# Patient Record
Sex: Male | Born: 1975
Health system: Southern US, Community
[De-identification: ages and names within clinical notes are randomized; demographics above are authoritative.]

## PROBLEM LIST (undated history)

## (undated) DIAGNOSIS — K219 Gastro-esophageal reflux disease without esophagitis: Secondary | ICD-10-CM

## (undated) DIAGNOSIS — R011 Cardiac murmur, unspecified: Secondary | ICD-10-CM

## (undated) DIAGNOSIS — J209 Acute bronchitis, unspecified: Secondary | ICD-10-CM

## (undated) DIAGNOSIS — R1032 Left lower quadrant pain: Secondary | ICD-10-CM

## (undated) HISTORY — PX: WISDOM TOOTH EXTRACTION: SHX21

## (undated) HISTORY — DX: Left lower quadrant pain: R10.32

## (undated) HISTORY — DX: Cardiac murmur, unspecified: R01.1

## (undated) HISTORY — DX: Acute bronchitis, unspecified: J20.9

## (undated) HISTORY — DX: Gastro-esophageal reflux disease without esophagitis: K21.9

---

## 2011-04-15 ENCOUNTER — Encounter (INDEPENDENT_AMBULATORY_CARE_PROVIDER_SITE_OTHER): Payer: Self-pay | Admitting: Surgery

## 2011-04-25 ENCOUNTER — Ambulatory Visit (INDEPENDENT_AMBULATORY_CARE_PROVIDER_SITE_OTHER): Payer: 59 | Admitting: Surgery

## 2011-05-09 ENCOUNTER — Encounter (INDEPENDENT_AMBULATORY_CARE_PROVIDER_SITE_OTHER): Payer: Self-pay | Admitting: Surgery

## 2011-05-10 ENCOUNTER — Ambulatory Visit (INDEPENDENT_AMBULATORY_CARE_PROVIDER_SITE_OTHER): Payer: 59 | Admitting: Surgery

## 2011-05-20 ENCOUNTER — Encounter (INDEPENDENT_AMBULATORY_CARE_PROVIDER_SITE_OTHER): Payer: Self-pay | Admitting: Surgery

## 2011-06-08 ENCOUNTER — Encounter (INDEPENDENT_AMBULATORY_CARE_PROVIDER_SITE_OTHER): Payer: Self-pay | Admitting: Surgery

## 2011-06-13 ENCOUNTER — Encounter (INDEPENDENT_AMBULATORY_CARE_PROVIDER_SITE_OTHER): Payer: Self-pay | Admitting: Surgery

## 2011-06-13 ENCOUNTER — Ambulatory Visit (INDEPENDENT_AMBULATORY_CARE_PROVIDER_SITE_OTHER): Payer: 59 | Admitting: Surgery

## 2011-06-13 VITALS — BP 120/88 | HR 64 | Temp 97.6°F | Resp 18 | Ht 71.0 in | Wt 200.4 lb

## 2011-06-13 DIAGNOSIS — R1032 Left lower quadrant pain: Secondary | ICD-10-CM

## 2011-06-13 NOTE — Progress Notes (Signed)
Chief Complaint  Patient presents with  . New Evaluation    eval of left inguinal hernia     HPI Isaac Adams is a 35 y.o. male.   HPIThis gentleman is referred by Dr. Cain Saupe for evaluation of a possible left inguinal hernia. 4 years, the patient reports he has had some intermittent sharp discomfort in his left inguinal area. It will sometimes ache on his thigh and into his left testicle. He has never noticed a bulge in the groin.  Past Medical History  Diagnosis Date  . Inguinal hernia   . Acute bronchitis   . Abdominal pain, left lower quadrant   . Heart murmur   . Nasal congestion   . Lt groin pain     occasional     Past Surgical History  Procedure Date  . Wisdom tooth extraction     History reviewed. No pertinent family history.  Social History History  Substance Use Topics  . Smoking status: Former Smoker    Types: Cigarettes  . Smokeless tobacco: Not on file  . Alcohol Use: Yes    No Known Allergies  Current Outpatient Prescriptions  Medication Sig Dispense Refill  . omeprazole (PRILOSEC) 40 MG capsule Take 40 mg by mouth daily.          Review of Systems Review of Systems  Constitutional: Negative for fever, chills and unexpected weight change.  HENT: Negative for hearing loss, congestion, sore throat, trouble swallowing and voice change.   Eyes: Negative for visual disturbance.  Respiratory: Negative for cough and wheezing.   Cardiovascular: Negative for chest pain, palpitations and leg swelling.  Gastrointestinal: Negative for nausea, vomiting, abdominal pain, diarrhea, constipation, blood in stool, abdominal distention, anal bleeding and rectal pain.  Genitourinary: Negative for hematuria and difficulty urinating.  Musculoskeletal: Negative for arthralgias.  Skin: Negative for rash and wound.  Neurological: Negative for seizures, syncope, weakness and headaches.  Hematological: Negative for adenopathy. Does not bruise/bleed easily.    Psychiatric/Behavioral: Negative for confusion.    Blood pressure 120/88, pulse 64, temperature 97.6 F (36.4 C), temperature source Temporal, resp. rate 18, height 5\' 11"  (1.803 m), weight 200 lb 6 oz (90.89 kg).  Physical Exam Physical Exam  Constitutional: He is oriented to person, place, and time. He appears well-developed and well-nourished. No distress.  HENT:  Head: Normocephalic and atraumatic.  Right Ear: External ear normal.  Left Ear: External ear normal.  Nose: Nose normal.  Mouth/Throat: Oropharynx is clear and moist.  Eyes: Conjunctivae are normal. Pupils are equal, round, and reactive to light. No scleral icterus.  Neck: Normal range of motion. Neck supple. No tracheal deviation present.  Cardiovascular: Normal rate, regular rhythm, normal heart sounds and intact distal pulses.   Pulmonary/Chest: Effort normal and breath sounds normal. No respiratory distress. He has no wheezes.  Abdominal: Soft. Bowel sounds are normal. He exhibits no distension. There is no tenderness. There is no rebound.       With the patient both lying and standing I cannot demonstrate either a left or right inguinal hernia.  Musculoskeletal: Normal range of motion. He exhibits edema. He exhibits no tenderness.  Lymphadenopathy:    He has no cervical adenopathy.  Neurological: He is alert and oriented to person, place, and time.  Skin: Skin is warm and dry. No rash noted. No erythema.  Psychiatric: His behavior is normal. Judgment normal.    Data Reviewed I had the primary care physician's notes which I have reviewed  Assessment  Patient with left groin pain of uncertain etiology    Plan    At this point, as I cannot feel inguinal hernia, we will follow this expectantly. This may represent just an acute muscle strength or a very early hernia. I discussed whether hernia repair should be performed one exploration without an obvious defect or bulge. At this point he would like to hold  surgery which I feel is very reasonable. He will call back and come see me should he develop a bulge in the groin       Elexis Pollak A 06/13/2011, 4:06 PM

## 2015-04-17 ENCOUNTER — Ambulatory Visit: Payer: Self-pay | Admitting: Family Medicine

## 2016-09-16 DIAGNOSIS — M791 Myalgia: Secondary | ICD-10-CM | POA: Diagnosis not present

## 2016-09-16 DIAGNOSIS — M940 Chondrocostal junction syndrome [Tietze]: Secondary | ICD-10-CM | POA: Diagnosis not present

## 2016-09-16 DIAGNOSIS — R079 Chest pain, unspecified: Secondary | ICD-10-CM | POA: Diagnosis not present

## 2016-12-23 ENCOUNTER — Ambulatory Visit: Payer: Self-pay | Admitting: Family Medicine

## 2016-12-26 ENCOUNTER — Ambulatory Visit (INDEPENDENT_AMBULATORY_CARE_PROVIDER_SITE_OTHER): Payer: BLUE CROSS/BLUE SHIELD | Admitting: Family Medicine

## 2016-12-26 ENCOUNTER — Encounter: Payer: Self-pay | Admitting: Family Medicine

## 2016-12-26 VITALS — BP 132/76 | Temp 98.1°F | Ht 71.0 in | Wt 208.5 lb

## 2016-12-26 DIAGNOSIS — M940 Chondrocostal junction syndrome [Tietze]: Secondary | ICD-10-CM

## 2016-12-26 DIAGNOSIS — K219 Gastro-esophageal reflux disease without esophagitis: Secondary | ICD-10-CM

## 2016-12-26 MED ORDER — OMEPRAZOLE 20 MG PO CPDR: 40.0000 mg | DELAYED_RELEASE_CAPSULE | Freq: Every day | ORAL | 0 refills | Status: DC

## 2016-12-26 NOTE — Progress Notes (Signed)
   Subjective:    Patient ID: Isaac FischerJoseph R. Douty, male    DOB: 10-13-1975, 41 y.o.   MRN: [id number]  HPI 41 yr old male to establish with us after transfering [facility] the [facility]. He has not had a check up in several years. His main concern today is chest pains that started about 3 months ago. These are sharp in quality though not severe. They seem to locate along the left anterior chest area. He notices that deep breathing and twisting his trunk can make these pains worse. The pains are never associated with exertion. He never has SOB or nausea or sweats with them. They last from 5 to 15 minutes a a time and then go away. He has been averaging 2-3 times a week for these pains. He has GERD and he knows what heartburn feels like. He says the chest pains are very different in nature.    Review of Systems  Constitutional: Negative.   Respiratory: Negative.   Cardiovascular: Positive for chest pain. Negative for palpitations and leg swelling.  Gastrointestinal: Negative.   Neurological: Negative.        Objective:   Physical Exam  Constitutional: He is oriented to person, place, and time. He appears well-developed and well-nourished. No distress.  Cardiovascular: Normal rate, regular rhythm, normal heart sounds and intact distal pulses.   Pulmonary/Chest: Effort normal and breath sounds normal. No respiratory distress. He has no wheezes. He has no rales. He exhibits no tenderness.  Abdominal: Soft. Bowel sounds are normal. He exhibits no distension and no mass. There is no tenderness. There is no rebound and no guarding.  Neurological: He is alert and oriented to person, place, and time.          Assessment & Plan:  The chest pains he describes today sound like costochondritis. We discussed the benign nature of these pains and I suggested he try Ibuprofen as needed. His GERD seems to be well controlled. He will set up a well exam with labs soon.  Gershon CraneStephen Blanch Stang, MD

## 2017-01-05 ENCOUNTER — Ambulatory Visit (INDEPENDENT_AMBULATORY_CARE_PROVIDER_SITE_OTHER): Payer: BLUE CROSS/BLUE SHIELD | Admitting: Family Medicine

## 2017-01-05 ENCOUNTER — Encounter: Payer: Self-pay | Admitting: Family Medicine

## 2017-01-05 VITALS — BP 106/81 | HR 85 | Temp 98.5°F | Ht 71.0 in | Wt 207.0 lb

## 2017-01-05 DIAGNOSIS — Z0000 Encounter for general adult medical examination without abnormal findings: Secondary | ICD-10-CM

## 2017-01-05 LAB — CBC WITH DIFFERENTIAL/PLATELET
Basophils Absolute: 0.1 10*3/uL (ref 0.0–0.1)
Basophils Relative: 0.7 % (ref 0.0–3.0)
Eosinophils Absolute: 0.2 10*3/uL (ref 0.0–0.7)
Eosinophils Relative: 3.2 % (ref 0.0–5.0)
HCT: 46.8 % (ref 39.0–52.0)
Hemoglobin: 16.2 g/dL (ref 13.0–17.0)
Lymphocytes Relative: 32.1 % (ref 12.0–46.0)
Lymphs Abs: 2.3 10*3/uL (ref 0.7–4.0)
MCHC: 34.7 g/dL (ref 30.0–36.0)
MCV: 89.6 fl (ref 78.0–100.0)
Monocytes Absolute: 0.4 10*3/uL (ref 0.1–1.0)
Monocytes Relative: 5.9 % (ref 3.0–12.0)
Neutro Abs: 4.1 10*3/uL (ref 1.4–7.7)
Neutrophils Relative %: 58.1 % (ref 43.0–77.0)
Platelets: 321 10*3/uL (ref 150.0–400.0)
RBC: 5.23 Mil/uL (ref 4.22–5.81)
RDW: 13.2 % (ref 11.5–15.5)
WBC: 7.1 10*3/uL (ref 4.0–10.5)

## 2017-01-05 LAB — BASIC METABOLIC PANEL
BUN: 13 mg/dL (ref 6–23)
CO2: 26 mEq/L (ref 19–32)
Calcium: 9.3 mg/dL (ref 8.4–10.5)
Chloride: 106 mEq/L (ref 96–112)
Creatinine, Ser: 1.06 mg/dL (ref 0.40–1.50)
GFR: 82.86 mL/min (ref >60.00)
Glucose, Bld: 100 mg/dL — ABNORMAL HIGH (ref 70–99)
Potassium: 4.2 mEq/L (ref 3.5–5.1)
Sodium: 138 mEq/L (ref 135–145)

## 2017-01-05 LAB — HEPATIC FUNCTION PANEL
ALT: 19 U/L (ref 0–53)
AST: 15 U/L (ref 0–37)
Albumin: 4.5 g/dL (ref 3.5–5.2)
Alkaline Phosphatase: 55 U/L (ref 39–117)
Bilirubin, Direct: 0.1 mg/dL (ref 0.0–0.3)
Total Bilirubin: 0.7 mg/dL (ref 0.2–1.2)
Total Protein: 7.1 g/dL (ref 6.0–8.3)

## 2017-01-05 LAB — POC URINALSYSI DIPSTICK (AUTOMATED)
Bilirubin, UA: NEGATIVE
Blood, UA: NEGATIVE
Glucose, UA: NEGATIVE
Ketones, UA: NEGATIVE
Leukocytes, UA: NEGATIVE
Nitrite, UA: NEGATIVE
Protein, UA: NEGATIVE
Spec Grav, UA: 1.025 (ref 1.010–1.025)
Urobilinogen, UA: 0.2 E.U./dL
pH, UA: 6 (ref 5.0–8.0)

## 2017-01-05 LAB — LIPID PANEL
Cholesterol: 252 mg/dL — ABNORMAL HIGH (ref 0–200)
HDL: 34.9 mg/dL — ABNORMAL LOW (ref >39.00)
NonHDL: 217.27
Total CHOL/HDL Ratio: 7
Triglycerides: 271 mg/dL — ABNORMAL HIGH (ref 0.0–149.0)
VLDL: 54.2 mg/dL — ABNORMAL HIGH (ref 0.0–40.0)

## 2017-01-05 LAB — TSH: TSH: 2.27 u[IU]/mL (ref 0.35–4.50)

## 2017-01-05 LAB — LDL CHOLESTEROL, DIRECT: Direct LDL: 161 mg/dL

## 2017-01-05 NOTE — Patient Instructions (Signed)
WE NOW OFFER   Isaac Adams's FAST TRACK!!!  SAME DAY Appointments for ACUTE CARE  Such as: Sprains, Injuries, cuts, abrasions, rashes, muscle pain, joint pain, back pain Colds, flu, sore throats, headache, allergies, cough, fever  Ear pain, sinus and eye infections Abdominal pain, nausea, vomiting, diarrhea, upset stomach Animal/insect bites  3 Easy Ways to Schedule: Walk-In Scheduling Call in scheduling Mychart Sign-up: [email or web address]         

## 2017-01-05 NOTE — Progress Notes (Signed)
   Subjective:    Patient ID: Isaac Adams, male    DOB: 05/21/1976, 41 y.o.   MRN: [id number]  HPI 41 yr old male for a well exam. He feels fine today. We recently saw him for some costochondritis pain, but this resolved.    Review of Systems  Constitutional: Negative.   HENT: Negative.   Eyes: Negative.   Respiratory: Negative.   Cardiovascular: Negative.   Gastrointestinal: Negative.   Genitourinary: Negative.   Musculoskeletal: Negative.   Skin: Negative.   Neurological: Negative.   Psychiatric/Behavioral: Negative.        Objective:   Physical Exam  Constitutional: He is oriented to person, place, and time. He appears well-developed and well-nourished. No distress.  HENT:  Head: Normocephalic and atraumatic.  Right Ear: External ear normal.  Left Ear: External ear normal.  Nose: Nose normal.  Mouth/Throat: Oropharynx is clear and moist. No oropharyngeal exudate.  Eyes: Conjunctivae and EOM are normal. Pupils are equal, round, and reactive to light. Right eye exhibits no discharge. Left eye exhibits no discharge. No scleral icterus.  Neck: Neck supple. No JVD present. No tracheal deviation present. No thyromegaly present.  Cardiovascular: Normal rate, regular rhythm, normal heart sounds and intact distal pulses.  Exam reveals no gallop and no friction rub.   No murmur heard. Pulmonary/Chest: Effort normal and breath sounds normal. No respiratory distress. He has no wheezes. He has no rales. He exhibits no tenderness.  Abdominal: Soft. Bowel sounds are normal. He exhibits no distension and no mass. There is no tenderness. There is no rebound and no guarding.  Genitourinary: Penis normal. No penile tenderness.  Musculoskeletal: Normal range of motion. He exhibits no edema or tenderness.  Lymphadenopathy:    He has no cervical adenopathy.  Neurological: He is alert and oriented to person, place, and time. He has normal reflexes. No cranial nerve deficit. He exhibits  normal muscle tone. Coordination normal.  Skin: Skin is warm and dry. No rash noted. He is not diaphoretic. No erythema. No pallor.  Psychiatric: He has a normal mood and affect. His behavior is normal. Judgment and thought content normal.          Assessment & Plan:  Well exam. We discussed diet and exercise. Get fasting labs.  Gershon CraneStephen Fry, MD

## 2017-01-17 ENCOUNTER — Encounter: Payer: Self-pay | Admitting: Family Medicine

## 2017-07-04 DIAGNOSIS — H5213 Myopia, bilateral: Secondary | ICD-10-CM | POA: Diagnosis not present

## 2017-07-07 ENCOUNTER — Ambulatory Visit (INDEPENDENT_AMBULATORY_CARE_PROVIDER_SITE_OTHER): Payer: BLUE CROSS/BLUE SHIELD

## 2017-07-07 ENCOUNTER — Ambulatory Visit (INDEPENDENT_AMBULATORY_CARE_PROVIDER_SITE_OTHER): Payer: BLUE CROSS/BLUE SHIELD | Admitting: Family Medicine

## 2017-07-07 ENCOUNTER — Encounter: Payer: Self-pay | Admitting: Family Medicine

## 2017-07-07 VITALS — BP 102/62 | HR 80 | Temp 98.2°F | Wt 209.4 lb

## 2017-07-07 DIAGNOSIS — R053 Chronic cough: Secondary | ICD-10-CM

## 2017-07-07 DIAGNOSIS — R05 Cough: Secondary | ICD-10-CM

## 2017-07-07 MED ORDER — HYDROCODONE-HOMATROPINE 5-1.5 MG/5ML PO SYRP: 5.0000 mL | ORAL_SOLUTION | ORAL | 0 refills | Status: DC | PRN

## 2017-07-07 NOTE — Progress Notes (Signed)
   Subjective:    Patient ID: Isaac Adams, male    DOB: 06-11-1976, 41 y.o.   MRN: [id number]  HPI Here to discuss a dry cough he has had for a month. He denies any allergy symptoms or PND. He has frequent heartburn but seldom takes anything for this. He is a former smoker.    Review of Systems  Constitutional: Negative.   HENT: Negative.   Eyes: Negative.   Respiratory: Positive for cough. Negative for apnea, choking, chest tightness, shortness of breath, wheezing and stridor.   Cardiovascular: Negative.   Neurological: Negative.        Objective:   Physical Exam  Constitutional: He is oriented to person, place, and time. He appears well-developed and well-nourished.  HENT:  Right Ear: External ear normal.  Left Ear: External ear normal.  Nose: Nose normal.  Mouth/Throat: Oropharynx is clear and moist.  Eyes: Conjunctivae are normal.  Neck: No thyromegaly present.  Cardiovascular: Normal rate, regular rhythm, normal heart sounds and intact distal pulses.  Pulmonary/Chest: Effort normal and breath sounds normal. No respiratory distress. He has no wheezes. He has no rales.  Lymphadenopathy:    He has no cervical adenopathy.  Neurological: He is alert and oriented to person, place, and time.          Assessment & Plan:  His cough is likely due to GERD he will take Omeprazole 20 mg daily. Set up a CXR.  Gershon CraneStephen Fry, MD

## 2017-07-12 ENCOUNTER — Telehealth: Payer: Self-pay | Admitting: Family Medicine

## 2017-07-12 NOTE — Telephone Encounter (Signed)
His CXR was normal and the lungs were clear

## 2017-07-12 NOTE — Telephone Encounter (Signed)
Copied from CRM [phone]. Topic: General - Other >> Jul 12, 2017 10:07 AM Percival SpanishKennedy, Cheryl W wrote:    Pt said he had xrays done last week and was suppose to received a call back on Monday. Pt req a call back with his results    [phone]

## 2017-07-12 NOTE — Telephone Encounter (Signed)
Sent to PCP looks like X-ray is in.

## 2017-07-12 NOTE — Telephone Encounter (Signed)
Called pt. Pt advised and voiced understanding.  

## 2017-07-26 ENCOUNTER — Ambulatory Visit (INDEPENDENT_AMBULATORY_CARE_PROVIDER_SITE_OTHER): Payer: BLUE CROSS/BLUE SHIELD

## 2017-07-26 ENCOUNTER — Ambulatory Visit (INDEPENDENT_AMBULATORY_CARE_PROVIDER_SITE_OTHER): Payer: BLUE CROSS/BLUE SHIELD | Admitting: Family Medicine

## 2017-07-26 ENCOUNTER — Encounter: Payer: Self-pay | Admitting: Family Medicine

## 2017-07-26 VITALS — BP 120/90 | HR 110 | Temp 99.1°F | Wt 209.9 lb

## 2017-07-26 DIAGNOSIS — M545 Low back pain, unspecified: Secondary | ICD-10-CM

## 2017-07-26 DIAGNOSIS — R000 Tachycardia, unspecified: Secondary | ICD-10-CM | POA: Diagnosis not present

## 2017-07-26 DIAGNOSIS — M47816 Spondylosis without myelopathy or radiculopathy, lumbar region: Secondary | ICD-10-CM | POA: Diagnosis not present

## 2017-07-26 MED ORDER — CYCLOBENZAPRINE HCL 5 MG PO TABS: 5.0000 mg | ORAL_TABLET | Freq: Three times a day (TID) | ORAL | 0 refills | Status: DC | PRN

## 2017-07-26 MED ORDER — MELOXICAM 7.5 MG PO TABS: 7.5000 mg | ORAL_TABLET | Freq: Every day | ORAL | 0 refills | Status: DC

## 2017-07-26 NOTE — Patient Instructions (Addendum)
Back Pain, Adult Many adults have back pain from time to time. Common causes of back pain include:  A strained muscle or ligament.  Wear and tear (degeneration) of the spinal disks.  Arthritis.  A hit to the back.  Back pain can be short-lived (acute) or last a long time (chronic). A physical exam, lab tests, and imaging studies may be done to find the cause of your pain. Follow these instructions at home: Managing pain and stiffness  Take over-the-counter and prescription medicines only as told by your health care provider.  If directed, apply heat to the affected area as often as told by your health care provider. Use the heat source that your health care provider recommends, such as a moist heat pack or a heating pad. ? Place a towel between your skin and the heat source. ? Leave the heat on for 20-30 minutes. ? Remove the heat if your skin turns bright red. This is especially important if you are unable to feel pain, heat, or cold. You have a greater risk of getting burned.  If directed, apply ice to the injured area: ? Put ice in a plastic bag. ? Place a towel between your skin and the bag. ? Leave the ice on for 20 minutes, 2-3 times a day for the first 2-3 days. Activity  Do not stay in bed. Resting more than 1-2 days can delay your recovery.  Take short walks on even surfaces as soon as you are able. Try to increase the length of time you walk each day.  Do not sit, drive, or stand in one place for more than 30 minutes at a time. Sitting or standing for long periods of time can put stress on your back.  Use proper lifting techniques. When you bend and lift, use positions that put less stress on your back: ? Bend your knees. ? Keep the load close to your body. ? Avoid twisting.  Exercise regularly as told by your health care provider. Exercising will help your back heal faster. This also helps prevent back injuries by keeping muscles strong and flexible.  Your health  care provider may recommend that you see a physical therapist. This person can help you come up with a safe exercise program. Do any exercises as told by your physical therapist. Lifestyle  Maintain a healthy weight. Extra weight puts stress on your back and makes it difficult to have good posture.  Avoid activities or situations that make you feel anxious or stressed. Learn ways to manage anxiety and stress. One way to manage stress is through exercise. Stress and anxiety increase muscle tension and can make back pain worse. General instructions  Sleep on a firm mattress in a comfortable position. Try lying on your side with your knees slightly bent. If you lie on your back, put a pillow under your knees.  Follow your treatment plan as told by your health care provider. This may include: ? Cognitive or behavioral therapy. ? Acupuncture or massage therapy. ? Meditation or yoga. Contact a health care provider if:  You have pain that is not relieved with rest or medicine.  You have increasing pain going down into your legs or buttocks.  Your pain does not improve in 2 weeks.  You have pain at night.  You lose weight.  You have a fever or chills. Get help right away if:  You develop new bowel or bladder control problems.  You have unusual weakness or numbness in your arms   or legs.  You develop nausea or vomiting.  You develop abdominal pain.  You feel faint. Summary  Many adults have back pain from time to time. A physical exam, lab tests, and imaging studies may be done to find the cause of your pain.  Use proper lifting techniques. When you bend and lift, use positions that put less stress on your back.  Take over-the-counter and prescription medicines and apply heat or ice as directed by your health care provider. This information is not intended to replace advice given to you by your health care provider. Make sure you discuss any questions you have with your health care  provider. Document Released: 07/25/2005 Document Revised: 08/29/2016 Document Reviewed: 08/29/2016 Elsevier Interactive Patient Education  2018 [employer]  Back Exercises The following exercises strengthen the muscles that help to support the back. They also help to keep the lower back flexible. Doing these exercises can help to prevent back pain or lessen existing pain. If you have back pain or discomfort, try doing these exercises 2-3 times each day or as told by your health care provider. When the pain goes away, do them once each day, but increase the number of times that you repeat the steps for each exercise (do more repetitions). If you do not have back pain or discomfort, do these exercises once each day or as told by your health care provider. Exercises Single Knee to Chest  Repeat these steps 3-5 times for each leg: 1. Lie on your back on a firm bed or the floor with your legs extended. 2. Bring one knee to your chest. Your other leg should stay extended and in contact with the floor. 3. Hold your knee in place by grabbing your knee or thigh. 4. Pull on your knee until you feel a gentle stretch in your lower back. 5. Hold the stretch for 10-30 seconds. 6. Slowly release and straighten your leg.  Pelvic Tilt  Repeat these steps 5-10 times: 1. Lie on your back on a firm bed or the floor with your legs extended. 2. Bend your knees so they are pointing toward the ceiling and your feet are flat on the floor. 3. Tighten your lower abdominal muscles to press your lower back against the floor. This motion will tilt your pelvis so your tailbone points up toward the ceiling instead of pointing to your feet or the floor. 4. With gentle tension and even breathing, hold this position for 5-10 seconds.  Cat-Cow  Repeat these steps until your lower back becomes more flexible: 1. Get into a hands-and-knees position on a firm surface. Keep your hands under your shoulders, and keep your  knees under your hips. You may place padding under your knees for comfort. 2. Let your head hang down, and point your tailbone toward the floor so your lower back becomes rounded like the back of a cat. 3. Hold this position for 5 seconds. 4. Slowly lift your head and point your tailbone up toward the ceiling so your back forms a sagging arch like the back of a cow. 5. Hold this position for 5 seconds.  Press-Ups  Repeat these steps 5-10 times: 1. Lie on your abdomen (face-down) on the floor. 2. Place your palms near your head, about shoulder-width apart. 3. While you keep your back as relaxed as possible and keep your hips on the floor, slowly straighten your arms to raise the top half of your body and lift your shoulders. Do not use your back   muscles to raise your upper torso. You may adjust the placement of your hands to make yourself more comfortable. 4. Hold this position for 5 seconds while you keep your back relaxed. 5. Slowly return to lying flat on the floor.  Bridges  Repeat these steps 10 times: 1. Lie on your back on a firm surface. 2. Bend your knees so they are pointing toward the ceiling and your feet are flat on the floor. 3. Tighten your buttocks muscles and lift your buttocks off of the floor until your waist is at almost the same height as your knees. You should feel the muscles working in your buttocks and the back of your thighs. If you do not feel these muscles, slide your feet 1-2 inches farther away from your buttocks. 4. Hold this position for 3-5 seconds. 5. Slowly lower your hips to the starting position, and allow your buttocks muscles to relax completely.  If this exercise is too easy, try doing it with your arms crossed over your chest. Abdominal Crunches  Repeat these steps 5-10 times: 1. Lie on your back on a firm bed or the floor with your legs extended. 2. Bend your knees so they are pointing toward the ceiling and your feet are flat on the  floor. 3. Cross your arms over your chest. 4. Tip your chin slightly toward your chest without bending your neck. 5. Tighten your abdominal muscles and slowly raise your trunk (torso) high enough to lift your shoulder blades a tiny bit off of the floor. Avoid raising your torso higher than that, because it can put too much stress on your low back and it does not help to strengthen your abdominal muscles. 6. Slowly return to your starting position.  Back Lifts Repeat these steps 5-10 times: 1. Lie on your abdomen (face-down) with your arms at your sides, and rest your forehead on the floor. 2. Tighten the muscles in your legs and your buttocks. 3. Slowly lift your chest off of the floor while you keep your hips pressed to the floor. Keep the back of your head in line with the curve in your back. Your eyes should be looking at the floor. 4. Hold this position for 3-5 seconds. 5. Slowly return to your starting position.  Contact a health care provider if:  Your back pain or discomfort gets much worse when you do an exercise.  Your back pain or discomfort does not lessen within 2 hours after you exercise. If you have any of these problems, stop doing these exercises right away. Do not do them again unless your health care provider says that you can. Get help right away if:  You develop sudden, severe back pain. If this happens, stop doing the exercises right away. Do not do them again unless your health care provider says that you can. This information is not intended to replace advice given to you by your health care provider. Make sure you discuss any questions you have with your health care provider. Document Released: 09/01/2004 Document Revised: 12/02/2015 Document Reviewed: 09/18/2014 Elsevier Interactive Patient Education  2017 Elsevier Inc.  

## 2017-07-26 NOTE — Progress Notes (Signed)
Subjective:    Patient ID: Herbert DeanerJoseph Rush Sebastiano, male    DOB: [birth year], 41 y.o.   MRN: [id number]  Chief Complaint  Patient presents with  . Back Pain    HPI Patient was seen today for acute concern.  Sunday while lifting a toilet seat to clean it patient felt a pop in his lower back.  Patient endorses low back pain.  A few days later he felt as if his legs would not support him, however patient denies pain, numbness, tingling in legs.  Patient has taken ibuprofen.  Past Medical History:  Diagnosis Date  . Abdominal pain, left lower quadrant   . Acute bronchitis   . GERD (gastroesophageal reflux disease)   . Heart murmur    as a child, resolved   . Inguinal hernia   . Lt groin pain    occasional     No Known Allergies  ROS General: Denies fever, chills, night sweats, changes in weight, changes in appetite HEENT: Denies headaches, ear pain, changes in vision, rhinorrhea, sore throat CV: Denies CP, palpitations, SOB, orthopnea Pulm: Denies SOB, cough, wheezing GI: Denies abdominal pain, nausea, vomiting, diarrhea, constipation GU: Denies dysuria, hematuria, frequency, vaginal discharge Msk: Denies muscle cramps, joint pains  + low back pain Neuro: Denies weakness, numbness, tingling Skin: Denies rashes, bruising Psych: Denies depression, anxiety, hallucinations     Objective:    Blood pressure 120/90, pulse (!) 110, temperature 99.1 F (37.3 C), temperature source Oral, weight 209 lb 14.4 oz (95.2 kg).   Gen. Pleasant, well-nourished, in no distress, normal affect   HEENT: [place]/AT, face symmetric, no scleral icterus, PERRLA, nares patent without drainage. Lungs: no accessory muscle use, CTAB, no wheezes or rales Cardiovascular: RRR, no m/r/g, no peripheral edema Musculoskeletal: Limited ROM of spine 2/2 pain with flexion and extension.  TTP of lumbar paraspinal muscles.  Normal strength in LEs b/l.  No deformities, no cyanosis or clubbing, normal tone Neuro:  A&Ox3, CN  II-XII intact, normal gait    Wt Readings from Last 3 Encounters:  07/26/17 209 lb 14.4 oz (95.2 kg)  07/07/17 209 lb 6.4 oz (95 kg)  01/05/17 207 lb (93.9 kg)    Lab Results  Component Value Date   WBC 7.1 01/05/2017   HGB 16.2 01/05/2017   HCT 46.8 01/05/2017   PLT 321.0 01/05/2017   GLUCOSE 100 (H) 01/05/2017   CHOL 252 (H) 01/05/2017   TRIG 271.0 (H) 01/05/2017   HDL 34.90 (L) 01/05/2017   LDLDIRECT 161.0 01/05/2017   ALT 19 01/05/2017   AST 15 01/05/2017   NA 138 01/05/2017   K 4.2 01/05/2017   CL 106 01/05/2017   CREATININE 1.06 01/05/2017   BUN 13 01/05/2017   CO2 26 01/05/2017   TSH 2.27 01/05/2017    Assessment/Plan:  Acute bilateral low back pain without sciatica  -Discussed likely disease course. -Given handout - Plan: DG Lumbar Spine Complete, cyclobenzaprine (FLEXERIL) 5 MG tablet, meloxicam (MOBIC) 7.5 MG tablet -Call patient with x-ray results -Follow-up PRN   Abbe AmsterdamShannon Thursa Emme, MD

## 2017-07-28 ENCOUNTER — Encounter: Payer: Self-pay | Admitting: Family Medicine

## 2017-07-28 NOTE — Telephone Encounter (Signed)
I saw the Xray report. Tell him to use the muscle relaxer tid and to take 2 of the Meloxicam tablets together once a day. Add Tylenol 500 mg to take 2 tabs bid. Use heat. If not better by Monday let me known, and we can arrange for some PT

## 2017-08-09 ENCOUNTER — Encounter: Payer: Self-pay | Admitting: Family Medicine

## 2017-08-09 DIAGNOSIS — M545 Low back pain, unspecified: Secondary | ICD-10-CM

## 2017-08-11 NOTE — Telephone Encounter (Signed)
I went ahead and referred him to PT

## 2017-08-14 ENCOUNTER — Ambulatory Visit (INDEPENDENT_AMBULATORY_CARE_PROVIDER_SITE_OTHER): Payer: BLUE CROSS/BLUE SHIELD | Admitting: Family Medicine

## 2017-08-14 ENCOUNTER — Encounter: Payer: Self-pay | Admitting: Family Medicine

## 2017-08-14 VITALS — BP 104/60 | HR 73 | Temp 99.0°F | Wt 205.8 lb

## 2017-08-14 DIAGNOSIS — M5442 Lumbago with sciatica, left side: Secondary | ICD-10-CM

## 2017-08-14 DIAGNOSIS — G8929 Other chronic pain: Secondary | ICD-10-CM

## 2017-08-14 DIAGNOSIS — M5441 Lumbago with sciatica, right side: Secondary | ICD-10-CM

## 2017-08-14 MED ORDER — DICLOFENAC SODIUM 75 MG PO TBEC: 75.0000 mg | DELAYED_RELEASE_TABLET | Freq: Two times a day (BID) | ORAL | 2 refills | Status: DC

## 2017-08-14 NOTE — Progress Notes (Signed)
   Subjective:    Patient ID: Isaac DeanerJoseph Rush Heffern, male    DOB: 27-Nov-1975, 42 y.o.   MRN: [id number]  HPI Here to follow up on low back pain. He has dealt with some pain off and on since he was a teenager and he was told he had some scoliosis years ago. He had the sudden onset of low back pain on 07-23-18 as he was bent over cleaning a toilet. He had Xrays taken which showed some lumbar scoliosis and some early spurring. He has been taking Meloxicam and Flexeril, and he has been doing stretches at home. He feels a little better but still has pain that radiates into both thighs. No numbness or weakness.    Review of Systems  Constitutional: Negative.   Respiratory: Negative.   Cardiovascular: Negative.   Musculoskeletal: Positive for back pain.       Objective:   Physical Exam  Constitutional: He appears well-developed and well-nourished. No distress.  Cardiovascular: Normal rate, regular rhythm, normal heart sounds and intact distal pulses.  Pulmonary/Chest: Effort normal and breath sounds normal. No respiratory distress. He has no wheezes. He has no rales.  Musculoskeletal:  He has slight scoliosis of the thoracic and lumbar spines. Full ROM. He is mildly tender over the lower spine. Negative SLR.           Assessment & Plan:  Low back pain due to a combination of early arthritis and mild scoliosis. Try Diclofenac 75 mg bid. Refer to PT.  Gershon CraneStephen Bonner Larue, MD

## 2017-08-15 NOTE — Telephone Encounter (Signed)
He was seen OV yesterday.

## 2017-08-16 ENCOUNTER — Ambulatory Visit: Payer: BLUE CROSS/BLUE SHIELD | Attending: Family Medicine | Admitting: Physical Therapy

## 2017-08-16 ENCOUNTER — Encounter: Payer: Self-pay | Admitting: Physical Therapy

## 2017-08-16 DIAGNOSIS — R293 Abnormal posture: Secondary | ICD-10-CM | POA: Insufficient documentation

## 2017-08-16 DIAGNOSIS — M5440 Lumbago with sciatica, unspecified side: Secondary | ICD-10-CM | POA: Diagnosis not present

## 2017-08-16 DIAGNOSIS — G8929 Other chronic pain: Secondary | ICD-10-CM | POA: Insufficient documentation

## 2017-08-16 NOTE — Patient Instructions (Signed)
Trigger Point Dry Needling  . What is Trigger Point Dry Needling (DN)? o DN is a physical therapy technique used to treat muscle pain and dysfunction. Specifically, DN helps deactivate muscle trigger points (muscle knots).  o A thin filiform needle is used to penetrate the skin and stimulate the underlying trigger point. The goal is for a local twitch response (LTR) to occur and for the trigger point to relax. No medication of any kind is injected during the procedure.   . What Does Trigger Point Dry Needling Feel Like?  o The procedure feels different for each individual patient. Some patients report that they do not actually feel the needle enter the skin and overall the process is not painful. Very mild bleeding may occur. However, many patients feel a deep cramping in the muscle in which the needle was inserted. This is the local twitch response.   Marland Kitchen. How Will I feel after the treatment? o Soreness is normal, and the onset of soreness may not occur for a few hours. Typically this soreness does not last longer than two days.  o Bruising is uncommon, however; ice can be used to decrease any possible bruising.  o In rare cases feeling tired or nauseous after the treatment is normal. In addition, your symptoms may get worse before they get better, this period will typically not last longer than 24 hours.   . What Can I do After My Treatment? o Increase your hydration by drinking more water for the next 24 hours. o You may place ice or heat on the areas treated that have become sore, however, do not use heat on inflamed or bruised areas. Heat often brings more relief post needling. o You can continue your regular activities, but vigorous activity is not recommended initially after the treatment for 24 hours. o DN is best combined with other physical therapy such as strengthening, stretching, and other therapies.       LUMBAR ROLL  Use a small lumbar roll in chairs you sit at frequently. Place  the roll at the lower curve of your back. This will help you maintain better posture.        Prone Press ups ([employer])  Lie flat on your stomach with your arms in push up position. Press up to the point of restriction and down again. Your therapist may want you to move your legs to the left or right before you press up. Repeat.  Every 2-3 hours, 10-15 reps      HIP FLEXOR STRETCH 4  While lying on a table or high bed, let the affected leg lower towards the floor until a stretch is felt along the front of your thigh.    At the same time, slowly bend your affected knee to add more stretch and grasp your opposite knee and pull it towards your chest.  Hold for 30 sec, repeat 2x   [facility] [address], [address] [place], [place] [address] Phone # [phone] Fax [phone]

## 2017-08-16 NOTE — Therapy (Signed)
[facility] [address] [place], [place], [address] Phone: [phone]   Fax:  [phone]  Physical Therapy Evaluation  Patient Details  Name: Isaac Adams MRN: [id number] Date of Birth: [birth year] Referring Provider: Gershon Crane, MD    Encounter Date: 08/16/2017  PT End of Session - 08/16/17 0935    Visit Number  1    Number of Visits  16    Date for PT Re-Evaluation  10/11/17    Authorization Type  BCBS    Authorization Time Period  08/16/17 to 10/11/17    PT Start Time  0846    PT Stop Time  0929    PT Time Calculation (min)  43 min    Activity Tolerance  No increased pain;Patient tolerated treatment well    Behavior During Therapy  [facility] for tasks assessed/performed       Past Medical History:  Diagnosis Date  . Abdominal pain, left lower quadrant   . Acute bronchitis   . GERD (gastroesophageal reflux disease)   . Heart murmur    as a child, resolved   . Inguinal hernia   . Lt groin pain    occasional     Past Surgical History:  Procedure Laterality Date  . WISDOM TOOTH EXTRACTION      There were no vitals filed for this visit.   Subjective Assessment - 08/16/17 0858    Subjective  Pt reports having long of back pain, would only last a day or so at a time. He did [employer] for about 10 years and would occasionally have flare ups of his pain. Back in December 2016, he was bending forward to clean the toilet when he felt a pop in his back and was unable to walk for the pain for 2-3 days. Since then, his pain improved overall, but he feels it has plateaued recently.     Pertinent History  GERD    Limitations  Sitting    How long can you sit comfortably?  30 min, less if unsupported     Patient Stated Goals  decrease pain or learn how to manage pain     Currently in Pain?  Yes    Pain Score  4     Pain Location  Back    Pain Descriptors / Indicators  Aching;Dull    Pain Type  Chronic pain     Pain Radiating Towards  none, but notes pain in the groin area     Pain Onset  More than a month ago    Pain Frequency  Intermittent    Aggravating Factors   sitting for too long    Pain Relieving Factors  standing    Effect of Pain on Daily Activities  unabel to sit long at work          [facility] PT Assessment - 08/16/17 0001      Assessment   Medical Diagnosis  Chronic LBP with sciatica    Referring Provider  Gershon Crane, MD     Onset Date/Surgical Date  07/23/17    Next MD Visit  none for now     Prior Therapy  none       Precautions   Precautions  None      Balance Screen   Has the patient fallen in the past 6 months  No    Has the patient had a decrease in activity level because of a fear of falling?   No  Is the patient reluctant to leave their home because of a fear of falling?   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; currently using a standing desk which has helped      Cognition   Overall Cognitive Status  Within Functional Limits for tasks assessed      Sensation   Light Touch  Appears Intact      Posture/Postural Control   Posture/Postural Control  --    Posture Comments  Forward head, rounded shoulders, decreased lumbar lordosis in sitting      ROM / Strength   AROM / PROM / Strength  AROM;Strength      AROM   AROM Assessment Site  Lumbar    Lumbar Flexion  increased pain/stretch low back; flattening of lumbar spine    Lumbar Extension  pain free x10 reps      Strength   Overall Strength Comments  BLE grossly 5/5 MMT and pain free      Flexibility   Soft Tissue Assessment /Muscle Length  yes    Hamstrings  WNL    Quadriceps  (+) thomas test for hip flexor and quadriceps tightess, Lt and Rt       Palpation   Spinal mobility  hypomobile lumbar and thoracic spine    Palpation comment  muscle tension and tenderness along lumbar paraspinals      Special Tests     Special Tests  Lumbar    Lumbar Tests  FABER test;Prone Knee Bend Test;Straight Leg Raise      FABER test   findings  Negative      Prone Knee Bend Test   Findings  Negative      Straight Leg Raise   Findings  Negative             Objective measurements completed on examination: See above findings.      [facility] Adult PT Treatment/Exercise - 08/16/17 0001      Self-Care   Self-Care  Posture    Posture  towel roll for lumbar support       Exercises   Exercises  Lumbar      Lumbar Exercises: Stretches   Hip Flexor Stretch  1 rep;30 seconds    Hip Flexor Stretch Limitations  supine thomas position       Lumbar Exercises: Prone   Other Prone Lumbar Exercises  prone pressup x10 reps              PT Education - 08/16/17 0930    Education provided  Yes    Education Details  eval findings/POC; use of towel roll for lumbar support; directional preference for extension; dry neeedling    Person(s) Educated  Patient    Methods  Explanation;Handout    Comprehension  Verbalized understanding;Returned demonstration       PT Short Term Goals - 08/16/17 0936      PT SHORT TERM GOAL #1   Title  Pt will be independent with his initial HEP to decrease pain with sitting.     Time  4    Period  Weeks    Status  New    Target Date  09/13/17      PT SHORT TERM GOAL #2   Title  Pt will report atleast 40% improvement in pain with sitting, in combination with the proper use of a lumbar  towel roll for additional back support.     Time  4    Period  Weeks    Status  New        PT Long Term Goals - 08/16/17 1020      PT LONG TERM GOAL #1   Title  Pt will demo improved hip extension ROM to atleast neutral during thomas testing position, to improve his postural alignment and glute recruitment with activity.     Time  8    Period  Weeks    Status  New    Target Date  10/11/17      PT LONG TERM GOAL #2   Title  Pt will be able to lift atleast 25# box with proper  mechanics and without pain and therapist cuing x5 reps.     Time  8    Period  Weeks    Status  New      PT LONG TERM GOAL #3   Title  Pt will report atleast 75% reduction in his low back pain throughout the day, to improve his quality of life.    Time  8    Period  Weeks    Status  New      PT LONG TERM GOAL #4   Title  Pt will demonstrate improved posture awareness, evident by his ability to maintain upright unsupported seated posture during his session without cuing from the therapist.     Time  8    Period  Weeks    Status  New      PT LONG TERM GOAL #5   Title  Pt will score no more than 26% limitation on FOTO to reflect significant improvements in activity completion and pain.     Time  8    Period  Weeks    Status  New             Plan - 08/16/17 1226    Clinical Impression Statement  Pt is a 42 y.o M referred to OPPT with complaints of a recent exacerbation of low back pain while bending forward cleaning the house. He has improved some since, however recently notes a plateau in progress. He demonstrates limited lumbar AROM, pain with flexion primarily, in addition to increased pain with sitting more than 10-15 minutes. He does have significant limitations in hip flexor flexibility, poor posture awareness and increased muscle spasm/joint restrcition along the lumbar spine which is contributing to his poor sitting tolerance at this time. He would benefit from skilled PT to address his limitations in ROM, flexibility and decrease muscle spasm and pain to allow him to return to regular leisure and work activity without difficulty.     History and Personal Factors relevant to plan of care:  history of scoliosis and LBP over the course of 15+ years    Clinical Presentation  Stable    Clinical Presentation due to:  was improving, but now stable    Clinical Decision Making  Low    Rehab Potential  Good    PT Frequency  Other (comment) 1-2x/week    PT Duration  8 weeks    PT  Treatment/Interventions  ADLs/Self Care Home Management;Cryotherapy;Electrical Stimulation;Traction;Moist Heat;Therapeutic exercise;Therapeutic activities;Functional mobility training;Neuromuscular re-education;Patient/family education;Manual techniques;Passive range of motion;Dry needling;Taping    PT Next Visit Plan  follow up on pt interest in dry needling; lumbar/thoracic spine mobilizations, manual to address muscle spasm of lumbar spine; gentle lumbar ROM    PT Home Exercise Plan  prone press ups, hip flexor stretch, towel roll for lumbar support    Consulted and Agree with Plan of Care  Patient       Patient will benefit from skilled therapeutic intervention in order to improve the following deficits and impairments:  Decreased activity tolerance, Impaired flexibility, Hypomobility, Decreased range of motion, Decreased mobility, Increased muscle spasms, Postural dysfunction, Pain, Improper body mechanics  Visit Diagnosis: Chronic bilateral low back pain with sciatica, sciatica laterality unspecified  Abnormal posture     Problem List Patient Active Problem List   Diagnosis Date Noted  . Low back pain with bilateral sciatica 08/14/2017  . GERD (gastroesophageal reflux disease) 12/26/2016   1:03 PM,08/16/17 Marylyn Ishihara PT, DPT [facility]  [phone]  [facility] [address], [address] [place], [place], [address] Phone: [phone]   Fax:  [phone]  Name: Isaac Adams MRN: [id number] Date of Birth: 1976-07-21

## 2017-08-23 ENCOUNTER — Ambulatory Visit: Payer: BLUE CROSS/BLUE SHIELD | Admitting: Physical Therapy

## 2017-08-23 DIAGNOSIS — R293 Abnormal posture: Secondary | ICD-10-CM | POA: Diagnosis not present

## 2017-08-23 DIAGNOSIS — M5440 Lumbago with sciatica, unspecified side: Secondary | ICD-10-CM | POA: Diagnosis not present

## 2017-08-23 DIAGNOSIS — G8929 Other chronic pain: Secondary | ICD-10-CM

## 2017-08-23 NOTE — Therapy (Signed)
[facility] [address], [address] [place], [place], [address] Phone: [phone]   Fax:  [phone]  Physical Therapy Treatment  Patient Details  Name: Isaac Adams MRN: [id number] Date of Birth: 03/22/1976 Referring Provider: Gershon Crane, MD    Encounter Date: 08/23/2017  PT End of Session - 08/23/17 0904    Visit Number  2    Number of Visits  16    Date for PT Re-Evaluation  10/11/17    Authorization Type  BCBS    Authorization Time Period  08/16/17 to 10/11/17    PT Start Time  0804    PT Stop Time  0847    PT Time Calculation (min)  43 min       Past Medical History:  Diagnosis Date  . Abdominal pain, left lower quadrant   . Acute bronchitis   . GERD (gastroesophageal reflux disease)   . Heart murmur    as a child, resolved   . Inguinal hernia   . Lt groin pain    occasional     Past Surgical History:  Procedure Laterality Date  . WISDOM TOOTH EXTRACTION      There were no vitals filed for this visit.  Subjective Assessment - 08/23/17 0808    Subjective  Pt states he has not felt much change since initial visit but is able to do the exercises    Pertinent History  GERD    Limitations  Sitting    How long can you sit comfortably?  30 min, less if unsupported     Patient Stated Goals  decrease pain or learn how to manage pain     Currently in Pain?  Yes    Pain Score  3     Pain Location  Back    Pain Orientation  Mid;Lower    Pain Descriptors / Indicators  Pressure    Pain Type  Chronic pain    Pain Radiating Towards  into the groin    Pain Onset  More than a month ago    Pain Frequency  Intermittent    Aggravating Factors   sitting increases when standing    Pain Relieving Factors  standing    Effect of Pain on Daily Activities  unable to sit long periods                      [facility] Adult PT Treatment/Exercise - 08/23/17 0001      Therapeutic Activites    Therapeutic  Activities  Other Therapeutic Activities    Other Therapeutic Activities  sit to stand with TrA and tucking tailbone      Neuro Re-ed    Neuro Re-ed Details   TrA bracing techniques in supine and sitting      Lumbar Exercises: Stretches   Hip Flexor Stretch  Right;Left;2 reps;20 seconds standing foot on chair      Lumbar Exercises: Supine   Ab Set  10 reps    Clam  20 reps red band    Bent Knee Raise  2 seconds;20 reps    Straight Leg Raise  10 reps    Large Ball Abdominal Isometric  20 reps      Lumbar Exercises: Prone   Other Prone Lumbar Exercises  prone pressup x5reps x 10 sec      Manual Therapy   Manual Therapy  Myofascial release;Soft tissue mobilization    Manual therapy comments  prone  Soft tissue mobilization  lumar erectors, QL, deep paraspinals bilaterally    Myofascial Release  thoracolumbar fascial and sacral distraction             PT Education - 08/23/17 0847    Education provided  Yes    Education Details  sit to stand with core contraction and hip flexor stretch in standing    Person(s) Educated  Patient    Methods  Explanation;Demonstration    Comprehension  Verbalized understanding;Returned demonstration       PT Short Term Goals - 08/23/17 0813      PT SHORT TERM GOAL #1   Title  Pt will be independent with his initial HEP to decrease pain with sitting.     Time  4    Period  Weeks    Status  On-going      PT SHORT TERM GOAL #2   Title  Pt will report atleast 40% improvement in pain with sitting, in combination with the proper use of a lumbar towel roll for additional back support.     Time  4    Period  Weeks    Status  On-going        PT Long Term Goals - 08/16/17 1020      PT LONG TERM GOAL #1   Title  Pt will demo improved hip extension ROM to atleast neutral during thomas testing position, to improve his postural alignment and glute recruitment with activity.     Time  8    Period  Weeks    Status  New    Target Date   10/11/17      PT LONG TERM GOAL #2   Title  Pt will be able to lift atleast 25# box with proper mechanics and without pain and therapist cuing x5 reps.     Time  8    Period  Weeks    Status  New      PT LONG TERM GOAL #3   Title  Pt will report atleast 75% reduction in his low back pain throughout the day, to improve his quality of life.    Time  8    Period  Weeks    Status  New      PT LONG TERM GOAL #4   Title  Pt will demonstrate improved posture awareness, evident by his ability to maintain upright unsupported seated posture during his session without cuing from the therapist.     Time  8    Period  Weeks    Status  New      PT LONG TERM GOAL #5   Title  Pt will score no more than 26% limitation on FOTO to reflect significant improvements in activity completion and pain.     Time  8    Period  Weeks    Status  New            Plan - 08/23/17 0848    Clinical Impression Statement  Pt has not achieved goals yet, it is his first treatment since eval.  Pt is doing stretches at home but having trouble with hip flexor stretch due to soft bed.  Pt has tight lumbar paraspinals, QL and thoracolumbar fascial bilaterally.  He experienced relief of syptoms with sacral distraction.  After receiving education for engaging TrA he did well with TrA activation in supine. Pt appeared to have good form and ability to engage core doing sit to stand after getting verbal  and visual cues with demonstration.  He will benefit from skilled PT to continue to progress core strength, hip and lumbar ROM, decrease muscle spasms and fascial adhesion in order to return to full function.    History and Personal Factors relevant to plan of care:  `    PT Treatment/Interventions  ADLs/Self Care Home Management;Cryotherapy;Electrical Stimulation;Traction;Moist Heat;Therapeutic exercise;Therapeutic activities;Functional mobility training;Neuromuscular re-education;Patient/family education;Manual  techniques;Passive range of motion;Dry needling;Taping    PT Next Visit Plan  lumbar/thoracic soft tissue myofascial release, sacral distraction, gentle ROM to tolerance and core stabilization exercises    Consulted and Agree with Plan of Care  Patient       Patient will benefit from skilled therapeutic intervention in order to improve the following deficits and impairments:  Decreased activity tolerance, Impaired flexibility, Hypomobility, Decreased range of motion, Decreased mobility, Increased muscle spasms, Postural dysfunction, Pain, Improper body mechanics  Visit Diagnosis: Chronic bilateral low back pain with sciatica, sciatica laterality unspecified  Abnormal posture     Problem List Patient Active Problem List   Diagnosis Date Noted  . Low back pain with bilateral sciatica 08/14/2017  . GERD (gastroesophageal reflux disease) 12/26/2016    Vincente PoliJakki Crosser, PT 08/23/2017, 9:07 AM  [facility] [address], [address] [place], [place], [address] Phone: [phone]   Fax:  [phone]  Name: Isaac Adams MRN: [id number] Date of Birth: 01-30-1976

## 2017-08-30 ENCOUNTER — Encounter: Payer: Self-pay | Admitting: Physical Therapy

## 2017-08-30 ENCOUNTER — Ambulatory Visit: Payer: BLUE CROSS/BLUE SHIELD | Admitting: Physical Therapy

## 2017-08-30 DIAGNOSIS — R293 Abnormal posture: Secondary | ICD-10-CM | POA: Diagnosis not present

## 2017-08-30 DIAGNOSIS — M5440 Lumbago with sciatica, unspecified side: Secondary | ICD-10-CM | POA: Diagnosis not present

## 2017-08-30 DIAGNOSIS — G8929 Other chronic pain: Secondary | ICD-10-CM

## 2017-08-30 NOTE — Patient Instructions (Signed)

## 2017-08-30 NOTE — Therapy (Addendum)
[facility] [address] [place], [place], [address] Phone: [phone]   Fax:  [phone]  Physical Therapy Treatment  Patient Details  Name: Isaac Adams MRN: [id number] Date of Birth: 1975/10/29 Referring Provider: Alysia Penna, MD    Encounter Date: 08/30/2017  PT End of Session - 08/30/17 0805    Visit Number  3    Date for PT Re-Evaluation  10/11/17    Authorization Type  BCBS    Authorization Time Period  08/16/17 to 10/11/17    PT Start Time  0805    PT Stop Time  0845    PT Time Calculation (min)  40 min    Activity Tolerance  No increased pain;Patient tolerated treatment well    Behavior During Therapy  [facility] for tasks assessed/performed       Past Medical History:  Diagnosis Date  . Abdominal pain, left lower quadrant   . Acute bronchitis   . GERD (gastroesophageal reflux disease)   . Heart murmur    as a child, resolved   . Inguinal hernia   . Lt groin pain    occasional     Past Surgical History:  Procedure Laterality Date  . WISDOM TOOTH EXTRACTION      There were no vitals filed for this visit.  Subjective Assessment - 08/30/17 0808    Subjective  Still tightens up a lot when sitting.  I am able to stand longer at the standing desk which helps. I don't notice anything sitting on the recumbant bike right now.    Limitations  Sitting    Patient Stated Goals  decrease pain or learn how to manage pain     Currently in Pain?  No/denies                      [facility] Adult PT Treatment/Exercise - 08/30/17 0001      Therapeutic Activites    Therapeutic Activities  --    Other Therapeutic Activities  --      Neuro Re-ed    Neuro Re-ed Details   TrA bracing techniques in supine and sitting      Lumbar Exercises: Stretches   Active Hamstring Stretch  Right;Left;2 reps;30 seconds    Hip Flexor Stretch  Right;Left;2 reps;20 seconds supine with sacral support - single knee to  chest opposite    Piriformis Stretch  Right;Left;2 reps;30 seconds      Lumbar Exercises: Aerobic   Stationary Bike  L 1 x 5 min      Lumbar Exercises: Supine   Ab Set  --    Clam  --    Bent Knee Raise  --    Straight Leg Raise  --    Large Ball Abdominal Isometric  --      Lumbar Exercises: Prone   Other Prone Lumbar Exercises  --      Manual Therapy   Manual Therapy  Myofascial release;Soft tissue mobilization    Manual therapy comments  prone    Soft tissue mobilization  lumar erectors, QL, deep paraspinals bilaterally    Myofascial Release  thoracolumbar fascial and sacral distraction       Trigger Point Dry Needling - 08/30/17 7782    Consent Given?  Yes    Education Handout Provided  Yes    Muscles Treated Upper Body  Quadratus Lumborum    Muscles Treated Lower Body  -- lumbar multifidi  PT Short Term Goals - 08/30/17 0859      PT SHORT TERM GOAL #1   Title  Pt will be independent with his initial HEP to decrease pain with sitting.     Time  4    Period  Weeks    Status  Achieved      PT SHORT TERM GOAL #2   Title  Pt will report atleast 40% improvement in pain with sitting, in combination with the proper use of a lumbar towel roll for additional back support.     Time  4    Period  Weeks    Status  On-going        PT Long Term Goals - 08/16/17 1020      PT LONG TERM GOAL #1   Title  Pt will demo improved hip extension ROM to atleast neutral during thomas testing position, to improve his postural alignment and glute recruitment with activity.     Time  8    Period  Weeks    Status  New    Target Date  10/11/17      PT LONG TERM GOAL #2   Title  Pt will be able to lift atleast 25# box with proper mechanics and without pain and therapist cuing x5 reps.     Time  8    Period  Weeks    Status  New      PT LONG TERM GOAL #3   Title  Pt will report atleast 75% reduction in his low back pain throughout the day, to improve his quality  of life.    Time  8    Period  Weeks    Status  New      PT LONG TERM GOAL #4   Title  Pt will demonstrate improved posture awareness, evident by his ability to maintain upright unsupported seated posture during his session without cuing from the therapist.     Time  8    Period  Weeks    Status  New      PT LONG TERM GOAL #5   Title  Pt will score no more than 26% limitation on FOTO to reflect significant improvements in activity completion and pain.     Time  8    Period  Weeks    Status  New            Plan - 08/30/17 4196    Clinical Impression Statement  Pt responded well to dry needling with the soft tissue massage, palpable increased tissue length and trigger point release.  Pt had trigger points throughout lumbar, QL, and hip flexors.  Pt was educated on dry needling aftercare and understands stretches to maintain newly achieved muscle length.  He will benefit from skilled PT to progress ROM and strength.    Rehab Potential  Good    PT Treatment/Interventions  ADLs/Self Care Home Management;Cryotherapy;Electrical Stimulation;Traction;Moist Heat;Therapeutic exercise;Therapeutic activities;Functional mobility training;Neuromuscular re-education;Patient/family education;Manual techniques;Passive range of motion;Dry needling;Taping    PT Next Visit Plan  f/u on response to dry needle to lumbar muscles, MFR and STM to lumbar, quadruped, core stability    Consulted and Agree with Plan of Care  Patient       Patient will benefit from skilled therapeutic intervention in order to improve the following deficits and impairments:  Decreased activity tolerance, Impaired flexibility, Hypomobility, Decreased range of motion, Decreased mobility, Increased muscle spasms, Postural dysfunction, Pain, Improper body mechanics  Visit Diagnosis: Chronic  bilateral low back pain with sciatica, sciatica laterality unspecified  Abnormal posture     Problem List Patient Active Problem List    Diagnosis Date Noted  . Low back pain with bilateral sciatica 08/14/2017  . GERD (gastroesophageal reflux disease) 12/26/2016    Zannie Cove, PT 08/30/2017, 9:04 AM  [facility] [address], [place], [place], [address] Phone: [phone]   Fax:  [phone]  Name: Isaac Adams MRN: [id number] Date of Birth: 1975-09-08  PHYSICAL THERAPY DISCHARGE SUMMARY  Visits from Start of Care: 3  Current functional level related to goals / functional outcomes: See above details   Remaining deficits: See above   Education / Equipment: HEP  Plan: Patient agrees to discharge.  Patient goals were not met. Patient is being discharged due to not returning since the last visit.  ?????   Google, PT 10/11/17 8:24 AM

## 2017-09-06 ENCOUNTER — Encounter: Payer: BLUE CROSS/BLUE SHIELD | Admitting: Physical Therapy

## 2017-09-12 ENCOUNTER — Encounter: Payer: Self-pay | Admitting: Family Medicine

## 2017-09-12 NOTE — Telephone Encounter (Signed)
We can just call this in. Call in Xanax 0.5 mg to take every 6 hours prn anxiety, #60 with 2 rf

## 2017-09-13 ENCOUNTER — Ambulatory Visit: Payer: BLUE CROSS/BLUE SHIELD | Attending: Family Medicine | Admitting: Physical Therapy

## 2017-09-13 ENCOUNTER — Other Ambulatory Visit: Payer: Self-pay

## 2017-09-13 ENCOUNTER — Telehealth: Payer: Self-pay | Admitting: Physical Therapy

## 2017-09-13 MED ORDER — ALPRAZOLAM ER 0.5 MG PO TB24: ORAL_TABLET | ORAL | 2 refills | Status: DC

## 2017-09-13 NOTE — Telephone Encounter (Signed)
Call in Xanax 0.5 mg to take every 6 hours prn anxiety, #60 with 2 rf   Rx has been called into pt's [facility] pharmacy.   Pt advised and voiced understanding.

## 2017-09-13 NOTE — Telephone Encounter (Signed)
Pt was called due to not showing for appointment today.  Left message on VM  Vincente PoliJakki Crosser, PT 09/13/17 8:37 AM

## 2018-01-29 ENCOUNTER — Encounter: Payer: Self-pay | Admitting: Family Medicine

## 2018-01-29 ENCOUNTER — Ambulatory Visit (INDEPENDENT_AMBULATORY_CARE_PROVIDER_SITE_OTHER): Payer: BLUE CROSS/BLUE SHIELD | Admitting: Family Medicine

## 2018-01-29 VITALS — BP 92/60 | HR 85 | Temp 98.7°F | Ht 71.0 in | Wt 208.0 lb

## 2018-01-29 DIAGNOSIS — R0981 Nasal congestion: Secondary | ICD-10-CM

## 2018-01-29 DIAGNOSIS — Z8249 Family history of ischemic heart disease and other diseases of the circulatory system: Secondary | ICD-10-CM

## 2018-01-29 DIAGNOSIS — E785 Hyperlipidemia, unspecified: Secondary | ICD-10-CM | POA: Diagnosis not present

## 2018-01-29 DIAGNOSIS — M940 Chondrocostal junction syndrome [Tietze]: Secondary | ICD-10-CM

## 2018-01-29 LAB — CBC WITH DIFFERENTIAL/PLATELET
Basophils Absolute: 0.1 10*3/uL (ref 0.0–0.1)
Basophils Relative: 0.8 % (ref 0.0–3.0)
Eosinophils Absolute: 0.3 10*3/uL (ref 0.0–0.7)
Eosinophils Relative: 4.1 % (ref 0.0–5.0)
HCT: 43.7 % (ref 39.0–52.0)
Hemoglobin: 15.4 g/dL (ref 13.0–17.0)
Lymphocytes Relative: 47.4 % — ABNORMAL HIGH (ref 12.0–46.0)
Lymphs Abs: 3.2 10*3/uL (ref 0.7–4.0)
MCHC: 35.3 g/dL (ref 30.0–36.0)
MCV: 89.3 fl (ref 78.0–100.0)
Monocytes Absolute: 0.4 10*3/uL (ref 0.1–1.0)
Monocytes Relative: 5.8 % (ref 3.0–12.0)
Neutro Abs: 2.8 10*3/uL (ref 1.4–7.7)
Neutrophils Relative %: 41.9 % — ABNORMAL LOW (ref 43.0–77.0)
Platelets: 340 10*3/uL (ref 150.0–400.0)
RBC: 4.9 Mil/uL (ref 4.22–5.81)
RDW: 13.4 % (ref 11.5–15.5)
WBC: 6.7 10*3/uL (ref 4.0–10.5)

## 2018-01-29 LAB — BASIC METABOLIC PANEL
BUN: 12 mg/dL (ref 6–23)
CO2: 26 mEq/L (ref 19–32)
Calcium: 9.3 mg/dL (ref 8.4–10.5)
Chloride: 105 mEq/L (ref 96–112)
Creatinine, Ser: 1.18 mg/dL (ref 0.40–1.50)
GFR: 72.81 mL/min (ref >60.00)
Glucose, Bld: 93 mg/dL (ref 70–99)
Potassium: 4.5 mEq/L (ref 3.5–5.1)
Sodium: 140 mEq/L (ref 135–145)

## 2018-01-29 LAB — LIPID PANEL
Cholesterol: 244 mg/dL — ABNORMAL HIGH (ref 0–200)
HDL: 36 mg/dL — ABNORMAL LOW (ref >39.00)
NonHDL: 207.77
Total CHOL/HDL Ratio: 7
Triglycerides: 356 mg/dL — ABNORMAL HIGH (ref 0.0–149.0)
VLDL: 71.2 mg/dL — ABNORMAL HIGH (ref 0.0–40.0)

## 2018-01-29 LAB — HEPATIC FUNCTION PANEL
ALT: 13 U/L (ref 0–53)
AST: 16 U/L (ref 0–37)
Albumin: 4.4 g/dL (ref 3.5–5.2)
Alkaline Phosphatase: 54 U/L (ref 39–117)
Bilirubin, Direct: 0.1 mg/dL (ref 0.0–0.3)
Total Bilirubin: 0.4 mg/dL (ref 0.2–1.2)
Total Protein: 7 g/dL (ref 6.0–8.3)

## 2018-01-29 LAB — LDL CHOLESTEROL, DIRECT: Direct LDL: 156 mg/dL

## 2018-01-29 LAB — TSH: TSH: 2.21 u[IU]/mL (ref 0.35–4.50)

## 2018-01-29 MED ORDER — AZELASTINE HCL 0.1 % NA SOLN
2.0000 | Freq: Two times a day (BID) | NASAL | 2 refills | Status: DC
Start: 2018-01-29 — End: 2019-05-17

## 2018-01-29 MED ORDER — METHYLPREDNISOLONE 4 MG PO TBPK: ORAL_TABLET | ORAL | 0 refills | Status: DC

## 2018-01-29 NOTE — Telephone Encounter (Signed)
The referral was done  

## 2018-01-29 NOTE — Progress Notes (Signed)
   Subjective:    Patient ID: Isaac Adams, male    DOB: 08-27-1975, 42 y.o.   MRN: [id number]  HPI Here for 3 things. First he has had an intermittent sharp pain in the right chest area tat radiates to the right shoulder for the past 6 months. No hx of trauma. He does not lift weights or do any repetitive activities. Ibuprofen helps somewhat. No cough or SOB. Second he had trouble with snoring at night and often feels that his nose is blocked during the day. He has tried Flonase without relief. Third he is concerned about possible heart disease. His father was recently found to have "plaque" in his coronary arteries. He has never been symptomatic, but he does have high cholesterol. Isaac Adams feels well even during exertion.    Review of Systems  Constitutional: Negative.   HENT: Positive for congestion. Negative for postnasal drip, sinus pressure and sinus pain.   Respiratory: Negative.   Cardiovascular: Positive for chest pain. Negative for palpitations and leg swelling.  Neurological: Negative.        Objective:   Physical Exam  Constitutional: He is oriented to person, place, and time. He appears well-developed and well-nourished.  HENT:  Right Ear: External ear normal.  Left Ear: External ear normal.  Nose: Nose normal.  Mouth/Throat: Oropharynx is clear and moist.  Neck: No thyromegaly present.  Cardiovascular: Normal rate, regular rhythm, normal heart sounds and intact distal pulses.  Pulmonary/Chest: Effort normal and breath sounds normal. No stridor. No respiratory distress. He has no wheezes. He has no rales.  He is tender along the right sternal margin   Lymphadenopathy:    He has no cervical adenopathy.  Neurological: He is alert and oriented to person, place, and time.          Assessment & Plan:  He has costochondritis and we will treat this with a Medrol dose pack. For the nasal congestion he will try Astelin sprays bid. Since he has an apparent family hx of  heart disease we will get fasting labs today to check his lipids, etc.  Gershon CraneStephen Fry, MD

## 2018-01-31 NOTE — Telephone Encounter (Signed)
Tell him his CBC was absolutely normal. He had a couple values that are very slightly out of the "normal range" but these are nothing to worry about. It is very rare that we get a report back on anybody which has every single result within range. As for a referral, was this supposed to be to Cardiology?

## 2018-02-01 MED ORDER — ATORVASTATIN CALCIUM 20 MG PO TABS: 20.0000 mg | ORAL_TABLET | Freq: Every day | ORAL | 3 refills | Status: DC

## 2018-02-06 ENCOUNTER — Other Ambulatory Visit: Payer: Self-pay

## 2018-02-09 NOTE — Addendum Note (Signed)
Addended by: Gershon CraneFRY, STEPHEN A on: 02/09/2018 04:09 PM   Modules accepted: Orders

## 2018-04-06 ENCOUNTER — Encounter: Payer: Self-pay | Admitting: Family Medicine

## 2018-04-06 ENCOUNTER — Ambulatory Visit (INDEPENDENT_AMBULATORY_CARE_PROVIDER_SITE_OTHER): Payer: BLUE CROSS/BLUE SHIELD | Admitting: Family Medicine

## 2018-04-06 VITALS — BP 110/80 | HR 100 | Temp 98.5°F | Ht 71.0 in | Wt 203.4 lb

## 2018-04-06 DIAGNOSIS — Z23 Encounter for immunization: Secondary | ICD-10-CM

## 2018-04-06 DIAGNOSIS — R1310 Dysphagia, unspecified: Secondary | ICD-10-CM

## 2018-04-06 DIAGNOSIS — S91331A Puncture wound without foreign body, right foot, initial encounter: Secondary | ICD-10-CM | POA: Diagnosis not present

## 2018-04-06 DIAGNOSIS — K219 Gastro-esophageal reflux disease without esophagitis: Secondary | ICD-10-CM

## 2018-04-06 MED ORDER — ESOMEPRAZOLE MAGNESIUM 40 MG PO CPDR: 40.0000 mg | DELAYED_RELEASE_CAPSULE | Freq: Every day | ORAL | 3 refills | Status: DC

## 2018-04-06 NOTE — Progress Notes (Signed)
   Subjective:    Patient ID: Isaac Adams, male    DOB: Feb 04, 1976, 42 y.o.   MRN: [id number]  HPI Here with concerns about an injury to the right foot. About 4 weeks ago he stepped on a nail and the wound seemed to heal well. No local redness or fever. Then he developed some soreness and stiffness in the neck one week ago. He is worried about possible tetanus.    Review of Systems  Constitutional: Negative.   Respiratory: Negative.   Cardiovascular: Negative.   Musculoskeletal: Positive for neck stiffness. Negative for neck pain.  Skin: Positive for wound.       Objective:   Physical Exam  Constitutional: He appears well-developed and well-nourished.  Neck:  The neck is not tender and ROM is full   Cardiovascular: Normal rate, regular rhythm, normal heart sounds and intact distal pulses.  Pulmonary/Chest: Effort normal and breath sounds normal.  Skin:  The bottom of the right foot is normal. No signs of a wound, no erythema or tenderness          Assessment & Plan:  The puncture wound has healed well. He is reassured that he does not have tetanus. Given a TDaP.  Gershon CraneStephen Perian Tedder, MD

## 2018-04-10 NOTE — Addendum Note (Signed)
Addended by: Gracelyn Nurse on: 04/10/2018 08:26 AM   Modules accepted: Orders

## 2018-04-14 ENCOUNTER — Encounter: Payer: Self-pay | Admitting: Family Medicine

## 2018-04-16 ENCOUNTER — Encounter: Payer: Self-pay | Admitting: Family Medicine

## 2018-04-17 NOTE — Addendum Note (Signed)
Addended by: Gershon Crane A on: 04/17/2018 04:58 PM   Modules accepted: Orders

## 2018-04-17 NOTE — Telephone Encounter (Signed)
This is common with longstanding GERD. I will refer him to GI because I think he needs an upper endoscopy. They may also want to dilate his esophagus at the same time

## 2018-04-18 ENCOUNTER — Encounter: Payer: Self-pay | Admitting: Internal Medicine

## 2018-04-23 ENCOUNTER — Other Ambulatory Visit: Payer: Self-pay

## 2018-04-26 ENCOUNTER — Telehealth: Payer: Self-pay | Admitting: Family Medicine

## 2018-04-26 NOTE — Telephone Encounter (Signed)
Dr. Fry please advise. Thanks  

## 2018-04-26 NOTE — Telephone Encounter (Signed)
Copied from CRM [id number]. Topic: Inquiry >> Apr 26, 2018  8:26 AM Maia Pettiesrtiz, Kristie S wrote: Reason for CRM: Pt has a cold x 1 week, he is coughing (barking cough) & post nasal drip. Pt is leaving 9/20 3:00am to go to [place] for 14 days. His wife called requesting ABX or some type of medication in case he gets worse while he is gone. She also states the last time he traveled he got food poisoning and had diarrhea the entire flight home. Is there something that can prevent this or help if it happens? Please advise.  [facility] [address] - [place], [place] - [id number] [phone] (Phone) [phone] (Fax)

## 2018-04-27 NOTE — Telephone Encounter (Signed)
Called and spoke with pt to make him aware of the zpak that was going to be sent to the pharmacy.  Pt stated that he had to leave out early this morning and he was already in [place].  He will just use the cold meds that he has for now.

## 2018-04-27 NOTE — Telephone Encounter (Signed)
Call in a Zpack  ?

## 2018-04-30 ENCOUNTER — Other Ambulatory Visit: Payer: BLUE CROSS/BLUE SHIELD

## 2018-05-28 ENCOUNTER — Ambulatory Visit (INDEPENDENT_AMBULATORY_CARE_PROVIDER_SITE_OTHER): Payer: Self-pay | Admitting: Internal Medicine

## 2018-05-28 ENCOUNTER — Encounter: Payer: Self-pay | Admitting: Internal Medicine

## 2018-05-28 VITALS — BP 112/80 | HR 84 | Ht 71.0 in | Wt 208.4 lb

## 2018-05-28 DIAGNOSIS — R1310 Dysphagia, unspecified: Secondary | ICD-10-CM

## 2018-05-28 DIAGNOSIS — R0789 Other chest pain: Secondary | ICD-10-CM

## 2018-05-28 DIAGNOSIS — K219 Gastro-esophageal reflux disease without esophagitis: Secondary | ICD-10-CM

## 2018-05-28 NOTE — Patient Instructions (Signed)

## 2018-05-28 NOTE — Progress Notes (Signed)
HISTORY OF PRESENT ILLNESS:  Isaac Adams is a 42 y.o. male, self-employed in production, who was referred by his primary care provider Dr. Clent Ridges regarding GERD, chest pain, and dysphasia.  Patient reports a history of classic reflux since his teenage years.  He has been on PPI, prescription or over-the-counter, regularly for about 7 or 8 years.  On medication classic symptoms controlled.  Off medication significant symptoms present.  He did undergo upper endoscopy with Dr. Charlott Rakes Dec 16, 2014.  He was found to have linear furrows in the esophagus.  However, biopsies did not show eosinophilic esophagitis.  Examination was otherwise normal.  Currently he takes Nexium 40 mg daily.  Patient tells me that he has had intermittent problems with chest pain for years.  Typically happens when he is just sitting around.  He describes it as a flash and then dull.  May occur several times per week.  May last up to an hour.  Not associated with exertion, meals, or movement.  He is concerned about cancer having had a relative diagnosed with lung cancer with brain metastasis.  Review of outside blood work from January 29, 2018 finds normal comprehensive metabolic panel.  Normal CBC with hemoglobin 15.4.  His newest complaint is intermittent dysphasia to pills and solids.  This is been going on for several months.  GI review of systems is otherwise negative.  No relevant x-ray abnormalities  REVIEW OF SYSTEMS:  All non-GI ROS negative unless otherwise stated in the HPI except for sinus and allergies  Past Medical History:  Diagnosis Date  . Abdominal pain, left lower quadrant   . Acute bronchitis   . GERD (gastroesophageal reflux disease)   . Heart murmur    as a child, resolved   . Inguinal hernia   . Lt groin pain    occasional     Past Surgical History:  Procedure Laterality Date  . WISDOM TOOTH EXTRACTION      Social History [name]  reports that he has been smoking cigarettes. He  has never used smokeless tobacco. He reports that he drinks alcohol. He reports that he does not use drugs.  family history includes Lung cancer in his maternal uncle; Ovarian cancer in his maternal grandmother.  No Known Allergies     PHYSICAL EXAMINATION: Vital signs: BP 112/80   Pulse 84   Ht 5\' 11"  (1.803 m)   Wt 208 lb 6 oz (94.5 kg)   BMI 29.06 kg/m   Constitutional: generally well-appearing, no acute distress Psychiatric: alert and oriented x3, cooperative Eyes: extraocular movements intact, anicteric, conjunctiva pink Mouth: oral pharynx moist, no lesions Neck: supple no lymphadenopathy Cardiovascular: heart regular rate and rhythm, no murmur Lungs: clear to auscultation bilaterally Abdomen: soft, nontender, nondistended, no obvious ascites, no peritoneal signs, normal bowel sounds, no organomegaly Rectal: Omitted Extremities: no clubbing, cyanosis, or lower extremity edema bilaterally Skin: no lesions on visible extremities Neuro: No focal deficits.  Cranial nerves intact  ASSESSMENT:  1.  Chronic GERD.  Requires PPI to control symptoms 2.  Intermittent solid and pill dysphagia.  Rule out peptic stricture 3.  Atypical chest pain.  Chronic stable   PLAN:  1.  Reflux precautions 2.  Continue Nexium 40 mg daily 3.  Schedule upper endoscopy with possible esophageal dilation.The nature of the procedure, as well as the risks, benefits, and alternatives were carefully and thoroughly reviewed with the patient. Ample time for discussion and questions allowed. The patient understood, was satisfied,  and agreed to proceed. 4.  Reassurance regarding chest pain.

## 2018-06-12 IMAGING — DX DG LUMBAR SPINE COMPLETE 4+V
5 series · 5 of 5 positions shown · non-contrast
Comparison: None.

CLINICAL DATA: Lumbar back pain. Felt a pop when bending over. Pain
radiates into the abdomen and bilateral lower extremities.

EXAM:
LUMBAR SPINE - COMPLETE 4+ VIEW

[lumbar spine ap]
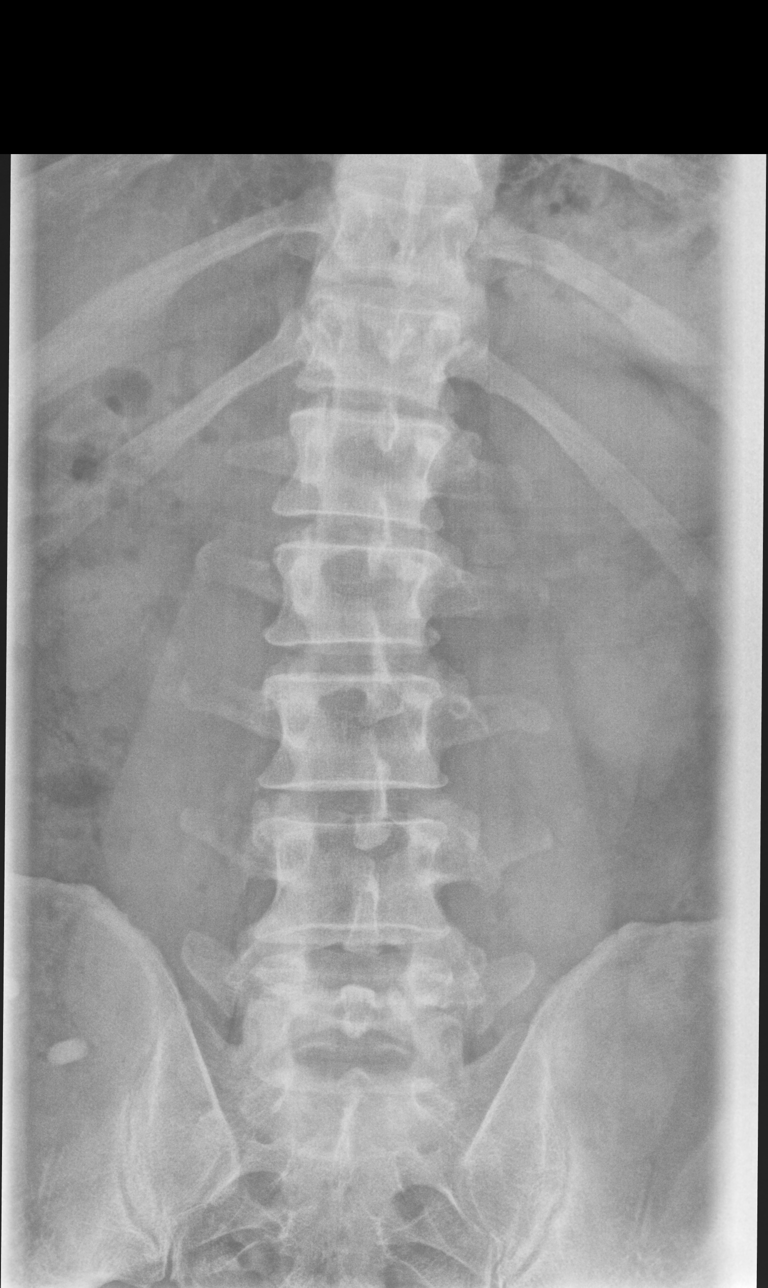

[lumbar spine oblique (1 of 2)]
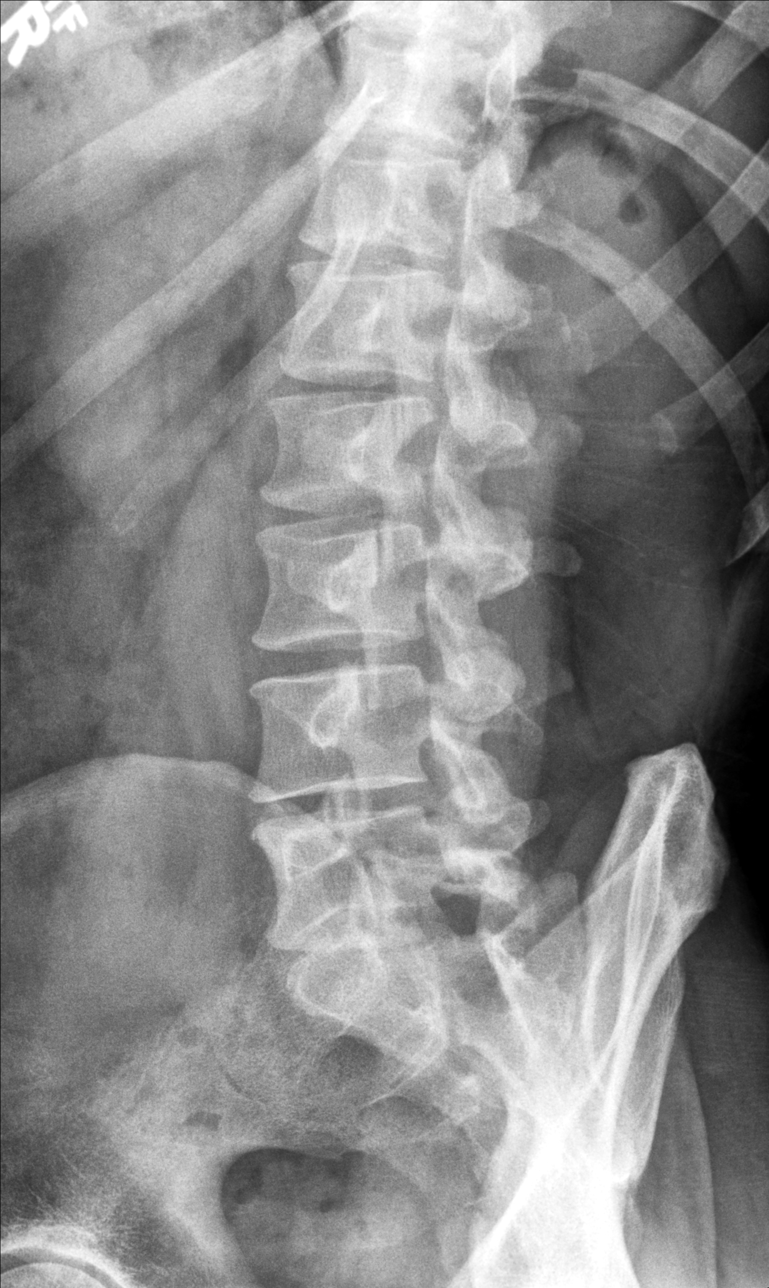

[lumbar spine oblique (2 of 2)]
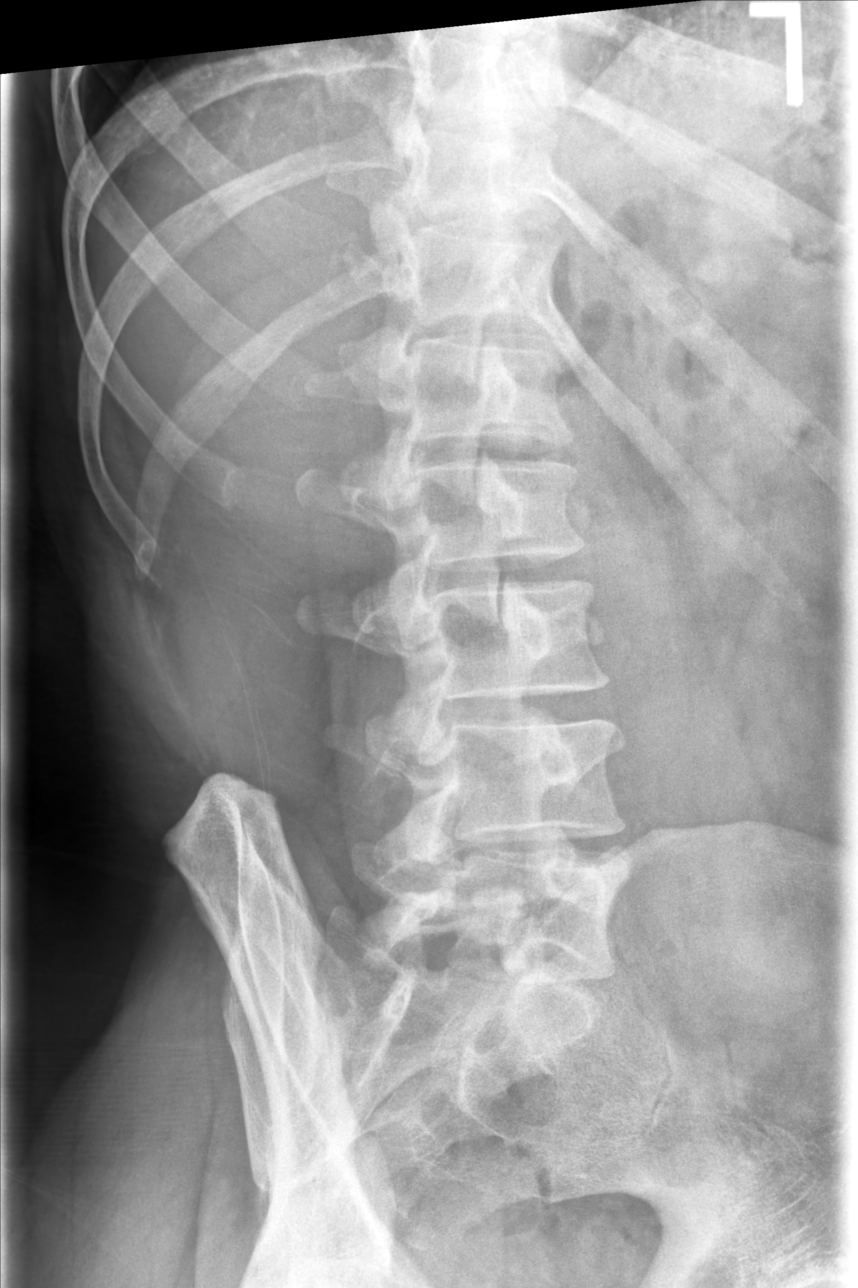

[lumbar spine lat (1 of 2)]
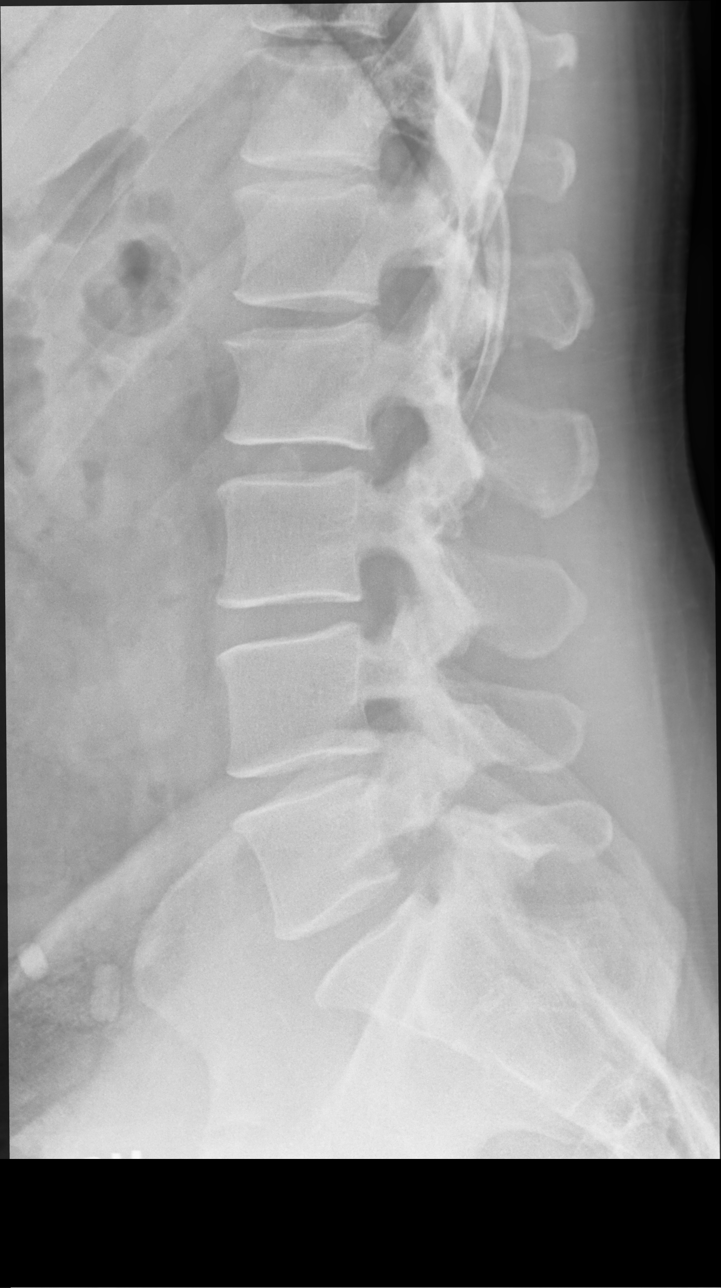

[lumbar spine lat (2 of 2)]
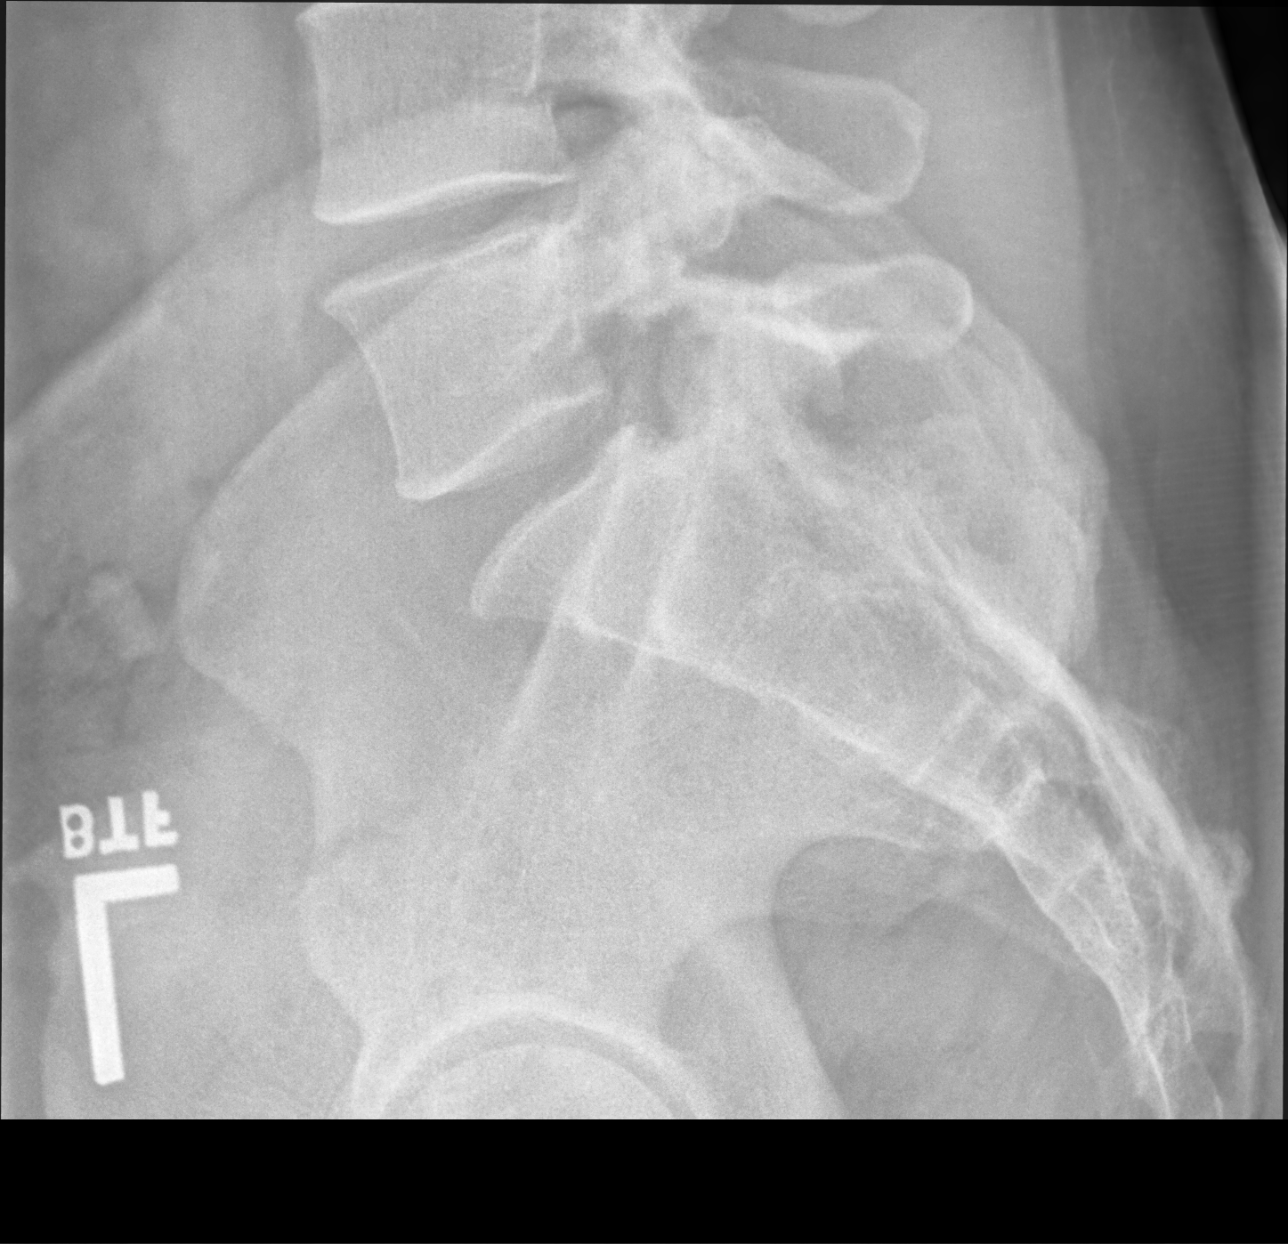

[5 of 5 positions shown; findings below may reference images not displayed]

FINDINGS: Mild broad-based dextroscoliotic curvature of the lumbar spine
Vertebral body heights are normal. There is no listhesis. The
posterior elements are intact. Disc spaces are preserved. Minimal
endplate spurring at L1-L2. No fracture. Sacroiliac joints are
symmetric and normal.
IMPRESSION: 1. Mild broad-based rightward curvature of the lumbar spine may be
positional or scoliosis.
2. Minimal spondylosis at L1-L2.

## 2018-06-28 ENCOUNTER — Encounter: Payer: Self-pay | Admitting: Internal Medicine

## 2018-06-28 ENCOUNTER — Ambulatory Visit (AMBULATORY_SURGERY_CENTER): Payer: BLUE CROSS/BLUE SHIELD | Admitting: Internal Medicine

## 2018-06-28 VITALS — BP 112/74 | HR 65 | Temp 97.8°F | Resp 14

## 2018-06-28 DIAGNOSIS — R0789 Other chest pain: Secondary | ICD-10-CM

## 2018-06-28 DIAGNOSIS — K219 Gastro-esophageal reflux disease without esophagitis: Secondary | ICD-10-CM

## 2018-06-28 DIAGNOSIS — K222 Esophageal obstruction: Secondary | ICD-10-CM | POA: Diagnosis not present

## 2018-06-28 DIAGNOSIS — R1310 Dysphagia, unspecified: Secondary | ICD-10-CM | POA: Diagnosis not present

## 2018-06-28 MED ORDER — SODIUM CHLORIDE 0.9 % IV SOLN: 500.0000 mL | Freq: Once | INTRAVENOUS | Status: DC

## 2018-06-28 NOTE — Patient Instructions (Signed)
Please follow Dilation Diet. Continue Nexium daily. Follow-up with Dr. Marina GoodellPerry in one year, sooner if needed. Please read handouts provided.     YOU HAD AN ENDOSCOPIC PROCEDURE TODAY AT THE [facility]:   Refer to the procedure report that was given to you for any specific questions about what was found during the examination.  If the procedure report does not answer your questions, please call your gastroenterologist to clarify.  If you requested that your care partner not be given the details of your procedure findings, then the procedure report has been included in a sealed envelope for you to review at your convenience later.  YOU SHOULD EXPECT: Some feelings of bloating in the abdomen. Passage of more gas than usual.  Walking can help get rid of the air that was put into your GI tract during the procedure and reduce the bloating. If you had a lower endoscopy (such as a colonoscopy or flexible sigmoidoscopy) you may notice spotting of blood in your stool or on the toilet paper. If you underwent a bowel prep for your procedure, you may not have a normal bowel movement for a few days.  Please Note:  You might notice some irritation and congestion in your nose or some drainage.  This is from the oxygen used during your procedure.  There is no need for concern and it should clear up in a day or so.  SYMPTOMS TO REPORT IMMEDIATELY:     Following upper endoscopy (EGD)  Vomiting of blood or coffee ground material  New chest pain or pain under the shoulder blades  Painful or persistently difficult swallowing  New shortness of breath  Fever of 100F or higher  Black, tarry-looking stools  For urgent or emergent issues, a gastroenterologist can be reached at any hour by calling [phone].   DIET:  Drink plenty of fluids but you should avoid alcoholic beverages for 24 hours.  ACTIVITY:  You should plan to take it easy for the rest of today and you should NOT DRIVE or use  heavy machinery until tomorrow (because of the sedation medicines used during the test).    FOLLOW UP: Our staff will call the number listed on your records the next business day following your procedure to check on you and address any questions or concerns that you may have regarding the information given to you following your procedure. If we do not reach you, we will leave a message.  However, if you are feeling well and you are not experiencing any problems, there is no need to return our call.  We will assume that you have returned to your regular daily activities without incident.  If any biopsies were taken you will be contacted by phone or by letter within the next 1-3 weeks.  Please call us at [phone] if you have not heard about the biopsies in 3 weeks.    SIGNATURES/CONFIDENTIALITY: You and/or your care partner have signed paperwork which will be entered into your electronic medical record.  These signatures attest to the fact that that the information above on your After Visit Summary has been reviewed and is understood.  Full responsibility of the confidentiality of this discharge information lies with you and/or your care-partner.

## 2018-06-28 NOTE — Progress Notes (Signed)
Report to PACU, RN, vss, BBS= Clear.  

## 2018-06-28 NOTE — Op Note (Addendum)
[facility] Patient Name: Isaac ScottsJoseph Adams Procedure Date: 06/28/2018 10:36 AM MRN: [id number] Endoscopist: Wilhemina BonitoJohn N. Marina GoodellPerry , MD Age: 42 Referring MD:  Date of Birth: 07-Sep-1975 Gender: Male Account #: [id number] Procedure:                Upper GI endoscopy with [facility]Maloney dilation of the                            esophagus Indications:              Dysphagia, Esophageal reflux Medicines:                Monitored Anesthesia Care Procedure:                Pre-Anesthesia Assessment:                           - Prior to the procedure, a History and Physical                            was performed, and patient medications and                            allergies were reviewed. The patient's tolerance of                            previous anesthesia was also reviewed. The risks                            and benefits of the procedure and the sedation                            options and risks were discussed with the patient.                            All questions were answered, and informed consent                            was obtained. Prior Anticoagulants: The patient has                            taken no previous anticoagulant or antiplatelet                            agents. ASA Grade Assessment: II - A patient with                            mild systemic disease. After reviewing the risks                            and benefits, the patient was deemed in                            satisfactory condition to undergo the procedure.  After obtaining informed consent, the endoscope was                            passed under direct vision. Throughout the                            procedure, the patient's blood pressure, pulse, and                            oxygen saturations were monitored continuously. The                            Endoscope was introduced through the mouth, and                            advanced to the second part of duodenum.  The upper                            GI endoscopy was accomplished without difficulty.                            The patient tolerated the procedure well. Scope In: Scope Out: Findings:                 One benign-appearing, intrinsic mild stenosis was                            found 40 cm from the incisors. This stenosis                            measured 1.6 cm (inner diameter). The scope was                            withdrawn. Dilation was performed with a Maloney                            dilator with no resistance at 54 Fr. The dilation                            site was examined following endoscope reinsertion                            and showed no change.                           The exam of the esophagus was otherwise normal.                           The stomach was normal.                           The examined duodenum was normal.                           The cardia and gastric  fundus were normal on                            retroflexion. Complications:            No immediate complications. Estimated Blood Loss:     Estimated blood loss: none. Impression:               1. GERD                           2. Large caliber ring status post dilation                           3. Otherwise normal exam. Recommendation:           1. Post dilation diet                           2. Continue Nexium daily                           3. Routine office follow-up with Dr. Marina Goodell in 1                            year. Sooner if needed                           4. Resume general medical care with Dr. Clent Ridges. Wilhemina Bonito. Marina Goodell, MD 06/28/2018 11:06:34 AM This report has been signed electronically.

## 2018-06-28 NOTE — Progress Notes (Signed)
Pt's states no medical or surgical changes since previsit or office visit. 

## 2018-06-28 NOTE — Progress Notes (Signed)
Called to room to assist during endoscopic procedure.  Patient ID and intended procedure confirmed with present staff. Received instructions for my participation in the procedure from the performing physician.  

## 2018-06-29 ENCOUNTER — Telehealth: Payer: Self-pay | Admitting: *Deleted

## 2018-06-29 NOTE — Telephone Encounter (Signed)
  Follow up Call-  Call back number 06/28/2018  Post procedure Call Back phone  # [phone]  Permission to leave phone message Yes  Some recent data might be hidden     Patient questions:  The mailbox is full.

## 2018-06-29 NOTE — Telephone Encounter (Signed)
  Follow up Call-  Call back number 06/28/2018  Post procedure Call Back phone  # [phone]  Permission to leave phone message Yes  Some recent data might be hidden     Patient questions:  Do you have a fever, pain , or abdominal swelling? No. Pain Score  0 *  Have you tolerated food without any problems? Yes.    Have you been able to return to your normal activities? Yes.    Do you have any questions about your discharge instructions: Diet   No. Medications  No. Follow up visit  No.  Do you have questions or concerns about your Care? No.  Actions: * If pain score is 4 or above: No action needed, pain <4.

## 2018-09-04 ENCOUNTER — Ambulatory Visit (INDEPENDENT_AMBULATORY_CARE_PROVIDER_SITE_OTHER): Payer: BLUE CROSS/BLUE SHIELD | Admitting: Family Medicine

## 2018-09-04 ENCOUNTER — Encounter: Payer: Self-pay | Admitting: Family Medicine

## 2018-09-04 VITALS — BP 120/80 | HR 98 | Temp 99.8°F | Wt 209.2 lb

## 2018-09-04 DIAGNOSIS — R52 Pain, unspecified: Secondary | ICD-10-CM | POA: Diagnosis not present

## 2018-09-04 DIAGNOSIS — J019 Acute sinusitis, unspecified: Secondary | ICD-10-CM

## 2018-09-04 LAB — POCT INFLUENZA A/B
Influenza A, POC: NEGATIVE
Influenza B, POC: NEGATIVE

## 2018-09-04 MED ORDER — AZITHROMYCIN 250 MG PO TABS: ORAL_TABLET | ORAL | 0 refills | Status: DC

## 2018-09-04 NOTE — Progress Notes (Signed)
   Subjective:    Patient ID: Isaac Adams, male    DOB: [birth year], 43 y.o.   MRN: [id number]  HPI Here for 4 days of fever, body aches, ST, a dry cough, and diarrhea. No nausea or vomiting. Drinking fluids and taking Ibuprofen and Sudafed.    Review of Systems  Constitutional: Positive for fever.  HENT: Positive for congestion, postnasal drip, sinus pressure and sore throat. Negative for sinus pain.   Eyes: Negative.   Respiratory: Positive for cough.   Gastrointestinal: Positive for diarrhea. Negative for abdominal distention, abdominal pain, constipation, nausea and vomiting.  Neurological: Negative.        Objective:   Physical Exam Constitutional:      Appearance: Normal appearance.  HENT:     Right Ear: Tympanic membrane and ear canal normal.     Left Ear: Tympanic membrane and ear canal normal.     Nose: Nose normal.     Mouth/Throat:     Pharynx: Oropharynx is clear.  Eyes:     Conjunctiva/sclera: Conjunctivae normal.  Pulmonary:     Effort: Pulmonary effort is normal.     Breath sounds: Normal breath sounds.  Lymphadenopathy:     Cervical: No cervical adenopathy.  Neurological:     Mental Status: He is alert.           Assessment & Plan:  Sinusitis, treat with a Zpack. Gershon Crane, MD

## 2018-11-07 ENCOUNTER — Other Ambulatory Visit: Payer: Self-pay | Admitting: Family Medicine

## 2018-11-13 NOTE — Telephone Encounter (Signed)
Call in #[phone] with 2 rf 

## 2019-03-22 ENCOUNTER — Other Ambulatory Visit: Payer: Self-pay | Admitting: Family Medicine

## 2019-04-02 ENCOUNTER — Encounter: Payer: Self-pay | Admitting: Family Medicine

## 2019-05-15 ENCOUNTER — Other Ambulatory Visit: Payer: Self-pay

## 2019-05-15 DIAGNOSIS — Z20828 Contact with and (suspected) exposure to other viral communicable diseases: Secondary | ICD-10-CM | POA: Diagnosis not present

## 2019-05-15 DIAGNOSIS — Z20822 Contact with and (suspected) exposure to covid-19: Secondary | ICD-10-CM

## 2019-05-17 ENCOUNTER — Telehealth (INDEPENDENT_AMBULATORY_CARE_PROVIDER_SITE_OTHER): Payer: BC Managed Care – PPO | Admitting: Family Medicine

## 2019-05-17 ENCOUNTER — Encounter: Payer: Self-pay | Admitting: Family Medicine

## 2019-05-17 ENCOUNTER — Other Ambulatory Visit: Payer: Self-pay

## 2019-05-17 ENCOUNTER — Telehealth: Payer: Self-pay | Admitting: *Deleted

## 2019-05-17 DIAGNOSIS — U071 COVID-19: Secondary | ICD-10-CM

## 2019-05-17 LAB — NOVEL CORONAVIRUS, NAA: SARS-CoV-2, NAA: DETECTED — AB

## 2019-05-17 MED ORDER — AZITHROMYCIN 250 MG PO TABS: ORAL_TABLET | ORAL | 0 refills | Status: DC

## 2019-05-17 NOTE — Telephone Encounter (Signed)
Pt returned call for positive covid-19 test result. He voiced understanding. Pt has spoken with his pcp regarding his positive result.

## 2019-05-17 NOTE — Progress Notes (Signed)
Virtual Visit via Video Note  I connected with the patient on 05/17/19 at  9:00 AM EDT by a video enabled telemedicine application and verified that I am speaking with the correct person using two identifiers.  Location patient: home Location provider:work or home office Persons participating in the virtual visit: patient, provider  I discussed the limitations of evaluation and management by telemedicine and the availability of in person appointments. The patient expressed understanding and agreed to proceed.   HPI: Here for an active Covid-19 infection. He began to have symptoms on 05-05-19 with mild body aches, fatigue, a dry cough, SOB, ST, and diarrhea. Since then he has been feeling better but the cough now produces green sputum. He has never had a fever. No N or V. No loss of taste or smell. He is drinking fluids. He says a Radio broadcast assistant in his office tested positive for the Covid virus, and they are typically in and out of each others offices during the day with no one wearing masks. He actually tested positive on 05-15-19. Since that day he has been quarantining at home, staying away from his family, and wearing a mask. His wife and son have no symtpoms, but they are going to be tested today.    ROS: See pertinent positives and negatives per HPI.  Past Medical History:  Diagnosis Date  . Abdominal pain, left lower quadrant   . Acute bronchitis   . GERD (gastroesophageal reflux disease)   . Heart murmur    as a child, resolved   . Inguinal hernia   . Lt groin pain    occasional     Past Surgical History:  Procedure Laterality Date  . WISDOM TOOTH EXTRACTION      Family History  Problem Relation Age of Onset  . Ovarian cancer Maternal Grandmother   . Lung cancer Maternal Uncle      Current Outpatient Medications:  .  ALPRAZolam (XANAX XR) 0.5 MG 24 hr tablet, Take 1 tablet every 6 hours as needed for anxiety, Disp: 60 tablet, Rfl: 2 .  ALPRAZolam (XANAX) 0.5 MG tablet, TAKE 1  TABLET BY MOUTH EVERY 6 HOURS AS NEEDED FOR ANXIETY, Disp: 60 tablet, Rfl: 2 .  atorvastatin (LIPITOR) 20 MG tablet, Take 1 tablet (20 mg total) by mouth daily., Disp: 90 tablet, Rfl: 3 .  azithromycin (ZITHROMAX Z-PAK) 250 MG tablet, As directed, Disp: 6 each, Rfl: 0 .  esomeprazole (NEXIUM) 40 MG capsule, Take 1 capsule by mouth once daily, Disp: 90 capsule, Rfl: 0  EXAM:  VITALS per patient if applicable:  GENERAL: alert, oriented, appears well and in no acute distress  HEENT: atraumatic, conjunttiva clear, no obvious abnormalities on inspection of external nose and ears  NECK: normal movements of the head and neck  LUNGS: on inspection no signs of respiratory distress, breathing rate appears normal, no obvious gross SOB, gasping or wheezing  CV: no obvious cyanosis  MS: moves all visible extremities without noticeable abnormality  PSYCH/NEURO: pleasant and cooperative, no obvious depression or anxiety, speech and thought processing grossly intact  ASSESSMENT AND PLAN: Covid-19 infection, now with an early bronchitis. We will treat this with a Zapck. I advised him to not return to work until 3 days of no symptoms have passed. Recheck prn.  Gershon Crane, MD  Discussed the following assessment and plan:  No diagnosis found.     I discussed the assessment and treatment plan with the patient. The patient was provided an opportunity to ask questions  and all were answered. The patient agreed with the plan and demonstrated an understanding of the instructions.   The patient was advised to call back or seek an in-person evaluation if the symptoms worsen or if the condition fails to improve as anticipated.

## 2019-05-21 ENCOUNTER — Other Ambulatory Visit: Payer: Self-pay

## 2019-05-21 DIAGNOSIS — Z20822 Contact with and (suspected) exposure to covid-19: Secondary | ICD-10-CM

## 2019-05-21 DIAGNOSIS — Z20828 Contact with and (suspected) exposure to other viral communicable diseases: Secondary | ICD-10-CM | POA: Diagnosis not present

## 2019-05-23 LAB — NOVEL CORONAVIRUS, NAA: SARS-CoV-2, NAA: NOT DETECTED

## 2019-06-17 ENCOUNTER — Other Ambulatory Visit: Payer: Self-pay | Admitting: Family Medicine

## 2019-06-21 ENCOUNTER — Encounter: Payer: Self-pay | Admitting: Family Medicine

## 2019-09-16 ENCOUNTER — Other Ambulatory Visit: Payer: Self-pay | Admitting: Family Medicine

## 2019-12-14 ENCOUNTER — Other Ambulatory Visit: Payer: Self-pay | Admitting: Family Medicine

## 2019-12-16 ENCOUNTER — Encounter: Payer: Self-pay | Admitting: Family Medicine

## 2020-03-13 ENCOUNTER — Other Ambulatory Visit: Payer: Self-pay | Admitting: *Deleted

## 2020-03-13 ENCOUNTER — Encounter: Payer: Self-pay | Admitting: *Deleted

## 2020-03-13 ENCOUNTER — Telehealth: Payer: Self-pay | Admitting: Family Medicine

## 2020-03-13 MED ORDER — ATORVASTATIN CALCIUM 20 MG PO TABS: 20.0000 mg | ORAL_TABLET | Freq: Every day | ORAL | 0 refills | Status: DC

## 2020-03-13 MED ORDER — ESOMEPRAZOLE MAGNESIUM 40 MG PO CPDR: 40.0000 mg | DELAYED_RELEASE_CAPSULE | Freq: Every day | ORAL | 0 refills | Status: DC

## 2020-03-13 NOTE — Telephone Encounter (Signed)
Patient informed that he need to schedule a follow-up visit for medication refills.

## 2020-03-13 NOTE — Telephone Encounter (Signed)
Pt called to say he needs a refill on   esomeprazole (NEXIUM) 40 MG capsule    atorvastatin (LIPITOR) 20 MG tablet   Also he wants to switch pharmacy to the [facility] [address] [place], [place], [place] [address]  [phone]  Please advise

## 2020-03-16 ENCOUNTER — Other Ambulatory Visit: Payer: Self-pay | Admitting: *Deleted

## 2020-03-16 DIAGNOSIS — R05 Cough: Secondary | ICD-10-CM | POA: Diagnosis not present

## 2020-03-16 MED ORDER — ESOMEPRAZOLE MAGNESIUM 40 MG PO CPDR: 40.0000 mg | DELAYED_RELEASE_CAPSULE | Freq: Every day | ORAL | 0 refills | Status: DC

## 2020-03-16 MED ORDER — ATORVASTATIN CALCIUM 20 MG PO TABS: 20.0000 mg | ORAL_TABLET | Freq: Every day | ORAL | 0 refills | Status: DC

## 2020-04-06 ENCOUNTER — Encounter: Payer: Self-pay | Admitting: Family Medicine

## 2020-04-06 ENCOUNTER — Ambulatory Visit (INDEPENDENT_AMBULATORY_CARE_PROVIDER_SITE_OTHER): Payer: BC Managed Care – PPO | Admitting: Family Medicine

## 2020-04-06 ENCOUNTER — Other Ambulatory Visit: Payer: Self-pay

## 2020-04-06 VITALS — BP 126/60 | HR 82 | Temp 98.7°F | Wt 207.8 lb

## 2020-04-06 DIAGNOSIS — L509 Urticaria, unspecified: Secondary | ICD-10-CM | POA: Diagnosis not present

## 2020-04-06 MED ORDER — EPINEPHRINE 0.3 MG/0.3ML IJ SOAJ: 0.3000 mg | INTRAMUSCULAR | 2 refills | Status: DC | PRN

## 2020-04-06 NOTE — Progress Notes (Signed)
   Subjective:    Patient ID: Isaac Adams, male    DOB: 06-20-1976, 44 y.o.   MRN: [id number]  HPI Here to ask about a reaction to bee stings. He has never reacted to them before. On 03-29-20 he was weed eating [place] fence [place] property when he came upon a yellow jacket nest. Before he could run into the house he was stung twice on the right shoulder, once on the left buttock, and once on the left upper chest. Other than pain and swelling at the sites he felt fine until yesterday he developed patches of red itchy skin around the sting sites that would come and go. No mouth swelling or SOB. He took Benadryl a few times, which helped. Then when he woke up this morning the rash was gone. As we speak he feels fine. He shows me pictures on his cell phone of the rash and this showed classic urticaria with erythema and wheals.    Review of Systems  Constitutional: Negative.   Respiratory: Negative.   Cardiovascular: Negative.   Skin: Positive for rash.       Objective:   Physical Exam Constitutional:      Appearance: Normal appearance.  Cardiovascular:     Rate and Rhythm: Normal rate and regular rhythm.     Pulses: Normal pulses.     Heart sounds: Normal heart sounds.  Pulmonary:     Effort: Pulmonary effort is normal.     Breath sounds: Normal breath sounds.  Skin:    Findings: No erythema or rash.  Neurological:     Mental Status: He is alert.           Assessment & Plan:  He had hives, which was likely a delayed hypersensitivity reaction to the bee stings. This appears to be resolved. We will get him some EpiPens to keep ay his house and in his vehicle to use if he gets stung again. Gershon Crane, MD

## 2020-04-15 ENCOUNTER — Other Ambulatory Visit: Payer: Self-pay

## 2020-04-16 ENCOUNTER — Ambulatory Visit (INDEPENDENT_AMBULATORY_CARE_PROVIDER_SITE_OTHER): Payer: Managed Care, Other (non HMO) | Admitting: Family Medicine

## 2020-04-16 ENCOUNTER — Encounter: Payer: Self-pay | Admitting: Family Medicine

## 2020-04-16 VITALS — BP 122/70 | HR 83 | Ht 71.0 in | Wt 204.1 lb

## 2020-04-16 DIAGNOSIS — Z0000 Encounter for general adult medical examination without abnormal findings: Secondary | ICD-10-CM

## 2020-04-16 MED ORDER — ALPRAZOLAM 0.5 MG PO TABS
0.5000 mg | ORAL_TABLET | Freq: Four times a day (QID) | ORAL | 5 refills | Status: DC | PRN
Start: 2020-04-16 — End: 2021-06-18

## 2020-04-16 MED ORDER — ATORVASTATIN CALCIUM 20 MG PO TABS: 20.0000 mg | ORAL_TABLET | Freq: Every day | ORAL | 3 refills | Status: DC

## 2020-04-16 MED ORDER — EPINEPHRINE 0.3 MG/0.3ML IJ SOAJ: 0.3000 mg | INTRAMUSCULAR | 2 refills | Status: AC | PRN

## 2020-04-16 MED ORDER — ESOMEPRAZOLE MAGNESIUM 40 MG PO CPDR: 40.0000 mg | DELAYED_RELEASE_CAPSULE | Freq: Every day | ORAL | 3 refills | Status: DC

## 2020-04-16 NOTE — Progress Notes (Signed)
   Subjective:    Patient ID: Isaac Adams, male    DOB: 05/30/1976, 44 y.o.   MRN: [id number]  HPI Here for a well exam. He is doing well. We saw him recently for hives, and these resolved with treatment. He is proud to say he quit smoking 3 months ago.     Review of Systems  Constitutional: Negative.   HENT: Negative.   Eyes: Negative.   Respiratory: Negative.   Cardiovascular: Negative.   Gastrointestinal: Negative.   Genitourinary: Negative.   Musculoskeletal: Negative.   Skin: Negative.   Neurological: Negative.   Psychiatric/Behavioral: Negative.        Objective:   Physical Exam Constitutional:      General: He is not in acute distress.    Appearance: He is well-developed. He is not diaphoretic.  HENT:     Head: Normocephalic and atraumatic.     Right Ear: External ear normal.     Left Ear: External ear normal.     Nose: Nose normal.     Mouth/Throat:     Pharynx: No oropharyngeal exudate.  Eyes:     General: No scleral icterus.       Right eye: No discharge.        Left eye: No discharge.     Conjunctiva/sclera: Conjunctivae normal.     Pupils: Pupils are equal, round, and reactive to light.  Neck:     Thyroid: No thyromegaly.     Vascular: No JVD.     Trachea: No tracheal deviation.  Cardiovascular:     Rate and Rhythm: Normal rate and regular rhythm.     Heart sounds: Normal heart sounds. No murmur heard.  No friction rub. No gallop.   Pulmonary:     Effort: Pulmonary effort is normal. No respiratory distress.     Breath sounds: Normal breath sounds. No wheezing or rales.  Chest:     Chest wall: No tenderness.  Abdominal:     General: Bowel sounds are normal. There is no distension.     Palpations: Abdomen is soft. There is no mass.     Tenderness: There is no abdominal tenderness. There is no guarding or rebound.  Genitourinary:    Penis: Normal. No tenderness.      Testes: Normal.  Musculoskeletal:        General: No tenderness. Normal  range of motion.     Cervical back: Neck supple.  Lymphadenopathy:     Cervical: No cervical adenopathy.  Skin:    General: Skin is warm and dry.     Coloration: Skin is not pale.     Findings: No erythema or rash.  Neurological:     Mental Status: He is alert and oriented to person, place, and time.     Cranial Nerves: No cranial nerve deficit.     Motor: No abnormal muscle tone.     Coordination: Coordination normal.     Deep Tendon Reflexes: Reflexes are normal and symmetric. Reflexes normal.  Psychiatric:        Behavior: Behavior normal.        Thought Content: Thought content normal.        Judgment: Judgment normal.           Assessment & Plan:  Well exam. We discussed diet and exercise. Get fasting labs.  Gershon Crane, MD

## 2020-04-17 LAB — CBC WITH DIFFERENTIAL/PLATELET
Absolute Monocytes: 342 cells/uL (ref 200–950)
Basophils Absolute: 53 cells/uL (ref 0–200)
Basophils Relative: 0.9 %
Eosinophils Absolute: 171 cells/uL (ref 15–500)
Eosinophils Relative: 2.9 %
HCT: 47.2 % (ref 38.5–50.0)
Hemoglobin: 16 g/dL (ref 13.2–17.1)
Lymphs Abs: 2148 cells/uL (ref 850–3900)
MCH: 30.6 pg (ref 27.0–33.0)
MCHC: 33.9 g/dL (ref 32.0–36.0)
MCV: 90.2 fL (ref 80.0–100.0)
MPV: 8.9 fL (ref 7.5–12.5)
Monocytes Relative: 5.8 %
Neutro Abs: 3186 cells/uL (ref 1500–7800)
Neutrophils Relative %: 54 %
Platelets: 325 10*3/uL (ref 140–400)
RBC: 5.23 10*6/uL (ref 4.20–5.80)
RDW: 12.5 % (ref 11.0–15.0)
Total Lymphocyte: 36.4 %
WBC: 5.9 10*3/uL (ref 3.8–10.8)

## 2020-04-17 LAB — BASIC METABOLIC PANEL
BUN: 12 mg/dL (ref 7–25)
CO2: 29 mmol/L (ref 20–32)
Calcium: 9.8 mg/dL (ref 8.6–10.3)
Chloride: 102 mmol/L (ref 98–110)
Creat: 1.14 mg/dL (ref 0.60–1.35)
Glucose, Bld: 92 mg/dL (ref 65–99)
Potassium: 4.6 mmol/L (ref 3.5–5.3)
Sodium: 140 mmol/L (ref 135–146)

## 2020-04-17 LAB — ABO AND RH

## 2020-04-17 LAB — LIPID PANEL
Cholesterol: 195 mg/dL (ref <200)
HDL: 47 mg/dL (ref >=40)
LDL Cholesterol (Calc): 127 mg/dL (calc) — ABNORMAL HIGH
Non-HDL Cholesterol (Calc): 148 mg/dL (calc) — ABNORMAL HIGH (ref <130)
Total CHOL/HDL Ratio: 4.1 (calc) (ref <5.0)
Triglycerides: 104 mg/dL (ref <150)

## 2020-04-17 LAB — HEPATIC FUNCTION PANEL
AG Ratio: 1.8 (calc) (ref 1.0–2.5)
ALT: 15 U/L (ref 9–46)
AST: 17 U/L (ref 10–40)
Albumin: 4.9 g/dL (ref 3.6–5.1)
Alkaline phosphatase (APISO): 63 U/L (ref 36–130)
Bilirubin, Direct: 0.2 mg/dL (ref 0.0–0.2)
Globulin: 2.7 g/dL (calc) (ref 1.9–3.7)
Indirect Bilirubin: 0.7 mg/dL (calc) (ref 0.2–1.2)
Total Bilirubin: 0.9 mg/dL (ref 0.2–1.2)
Total Protein: 7.6 g/dL (ref 6.1–8.1)

## 2020-04-17 LAB — TSH: TSH: 1.35 mIU/L (ref 0.40–4.50)

## 2020-04-22 ENCOUNTER — Encounter: Payer: Self-pay | Admitting: Family Medicine

## 2020-04-23 NOTE — Telephone Encounter (Signed)
These symptoms sound viral so antibiotics would not help him any. I do advise him to get tested for the Covid virus however

## 2021-04-02 ENCOUNTER — Other Ambulatory Visit: Payer: Self-pay | Admitting: Family Medicine

## 2021-04-17 ENCOUNTER — Encounter: Payer: Self-pay | Admitting: Family Medicine

## 2021-04-19 ENCOUNTER — Other Ambulatory Visit: Payer: Self-pay

## 2021-04-19 MED ORDER — ESOMEPRAZOLE MAGNESIUM 40 MG PO CPDR: 40.0000 mg | DELAYED_RELEASE_CAPSULE | Freq: Every day | ORAL | 0 refills | Status: DC

## 2021-05-19 ENCOUNTER — Other Ambulatory Visit: Payer: Self-pay | Admitting: Family Medicine

## 2021-05-19 MED ORDER — ESOMEPRAZOLE MAGNESIUM 40 MG PO CPDR: 40.0000 mg | DELAYED_RELEASE_CAPSULE | Freq: Every day | ORAL | 0 refills | Status: DC

## 2021-05-19 NOTE — Telephone Encounter (Signed)
Pt needs appointment for further refills 

## 2021-05-20 ENCOUNTER — Other Ambulatory Visit: Payer: Self-pay | Admitting: Family Medicine

## 2021-05-28 ENCOUNTER — Telehealth: Payer: Self-pay | Admitting: Family Medicine

## 2021-05-28 DIAGNOSIS — E785 Hyperlipidemia, unspecified: Secondary | ICD-10-CM

## 2021-05-28 DIAGNOSIS — R739 Hyperglycemia, unspecified: Secondary | ICD-10-CM

## 2021-05-28 NOTE — Telephone Encounter (Signed)
Patient called to schedule CPE and ask that blood work orders be put in before appointment so he can speak to Dr.Fry about it. CPE is scheduled for 11/07 @1 :00pm       Good callback number is [phone]      Please advise

## 2021-05-31 NOTE — Telephone Encounter (Signed)
Last office visit- 04/16/2020.   Please advise

## 2021-06-01 NOTE — Telephone Encounter (Signed)
Lab orders were placed.

## 2021-06-01 NOTE — Telephone Encounter (Signed)
Spoke with patient, lab appointment scheduled for 06/03/21 at 840am.   Patient is aware.

## 2021-06-03 ENCOUNTER — Other Ambulatory Visit (INDEPENDENT_AMBULATORY_CARE_PROVIDER_SITE_OTHER): Payer: BC Managed Care – PPO

## 2021-06-03 ENCOUNTER — Other Ambulatory Visit: Payer: Self-pay

## 2021-06-03 DIAGNOSIS — R739 Hyperglycemia, unspecified: Secondary | ICD-10-CM

## 2021-06-03 DIAGNOSIS — E785 Hyperlipidemia, unspecified: Secondary | ICD-10-CM

## 2021-06-03 LAB — BASIC METABOLIC PANEL
BUN: 15 mg/dL (ref 6–23)
CO2: 29 mEq/L (ref 19–32)
Calcium: 9.5 mg/dL (ref 8.4–10.5)
Chloride: 103 mEq/L (ref 96–112)
Creatinine, Ser: 1.23 mg/dL (ref 0.40–1.50)
GFR: 72.18 mL/min (ref >60.00)
Glucose, Bld: 106 mg/dL — ABNORMAL HIGH (ref 70–99)
Potassium: 4.3 mEq/L (ref 3.5–5.1)
Sodium: 140 mEq/L (ref 135–145)

## 2021-06-03 LAB — CBC WITH DIFFERENTIAL/PLATELET
Basophils Absolute: 0.1 10*3/uL (ref 0.0–0.1)
Basophils Relative: 0.8 % (ref 0.0–3.0)
Eosinophils Absolute: 0.2 10*3/uL (ref 0.0–0.7)
Eosinophils Relative: 3.7 % (ref 0.0–5.0)
HCT: 44.5 % (ref 39.0–52.0)
Hemoglobin: 15.5 g/dL (ref 13.0–17.0)
Lymphocytes Relative: 36.6 % (ref 12.0–46.0)
Lymphs Abs: 2.4 10*3/uL (ref 0.7–4.0)
MCHC: 35 g/dL (ref 30.0–36.0)
MCV: 87.9 fl (ref 78.0–100.0)
Monocytes Absolute: 0.4 10*3/uL (ref 0.1–1.0)
Monocytes Relative: 6.3 % (ref 3.0–12.0)
Neutro Abs: 3.4 10*3/uL (ref 1.4–7.7)
Neutrophils Relative %: 52.6 % (ref 43.0–77.0)
Platelets: 306 10*3/uL (ref 150.0–400.0)
RBC: 5.06 Mil/uL (ref 4.22–5.81)
RDW: 13 % (ref 11.5–15.5)
WBC: 6.5 10*3/uL (ref 4.0–10.5)

## 2021-06-03 LAB — HEPATIC FUNCTION PANEL
ALT: 30 U/L (ref 0–53)
AST: 23 U/L (ref 0–37)
Albumin: 4.5 g/dL (ref 3.5–5.2)
Alkaline Phosphatase: 57 U/L (ref 39–117)
Bilirubin, Direct: 0.1 mg/dL (ref 0.0–0.3)
Total Bilirubin: 0.8 mg/dL (ref 0.2–1.2)
Total Protein: 7.3 g/dL (ref 6.0–8.3)

## 2021-06-03 LAB — LIPID PANEL
Cholesterol: 168 mg/dL (ref 0–200)
HDL: 36.4 mg/dL — ABNORMAL LOW (ref >39.00)
LDL Cholesterol: 96 mg/dL (ref 0–99)
NonHDL: 131.87
Total CHOL/HDL Ratio: 5
Triglycerides: 180 mg/dL — ABNORMAL HIGH (ref 0.0–149.0)
VLDL: 36 mg/dL (ref 0.0–40.0)

## 2021-06-03 LAB — HEMOGLOBIN A1C: Hgb A1c MFr Bld: 5.8 % (ref 4.6–6.5)

## 2021-06-03 LAB — TSH: TSH: 2.32 u[IU]/mL (ref 0.35–5.50)

## 2021-06-05 ENCOUNTER — Other Ambulatory Visit: Payer: Self-pay | Admitting: Family Medicine

## 2021-06-05 ENCOUNTER — Encounter: Payer: Self-pay | Admitting: Family Medicine

## 2021-06-07 ENCOUNTER — Other Ambulatory Visit: Payer: Self-pay

## 2021-06-07 MED ORDER — ATORVASTATIN CALCIUM 20 MG PO TABS: 20.0000 mg | ORAL_TABLET | Freq: Every day | ORAL | 0 refills | Status: DC

## 2021-06-14 ENCOUNTER — Encounter: Payer: Managed Care, Other (non HMO) | Admitting: Family Medicine

## 2021-06-18 ENCOUNTER — Encounter: Payer: Self-pay | Admitting: Family Medicine

## 2021-06-18 ENCOUNTER — Ambulatory Visit (INDEPENDENT_AMBULATORY_CARE_PROVIDER_SITE_OTHER): Payer: BC Managed Care – PPO | Admitting: Family Medicine

## 2021-06-18 VITALS — BP 120/76 | HR 73 | Temp 98.0°F | Ht 71.0 in | Wt 218.0 lb

## 2021-06-18 DIAGNOSIS — Z8249 Family history of ischemic heart disease and other diseases of the circulatory system: Secondary | ICD-10-CM | POA: Diagnosis not present

## 2021-06-18 DIAGNOSIS — Z0000 Encounter for general adult medical examination without abnormal findings: Secondary | ICD-10-CM | POA: Diagnosis not present

## 2021-06-18 DIAGNOSIS — N529 Male erectile dysfunction, unspecified: Secondary | ICD-10-CM

## 2021-06-18 LAB — TESTOSTERONE: Testosterone: 389.71 ng/dL (ref 300.00–890.00)

## 2021-06-18 MED ORDER — ESOMEPRAZOLE MAGNESIUM 40 MG PO CPDR
40.0000 mg | DELAYED_RELEASE_CAPSULE | Freq: Every day | ORAL | 3 refills | Status: DC
Start: 2021-06-18 — End: 2022-06-17

## 2021-06-18 MED ORDER — ATORVASTATIN CALCIUM 20 MG PO TABS: 20.0000 mg | ORAL_TABLET | Freq: Every day | ORAL | 3 refills | Status: DC

## 2021-06-18 MED ORDER — SILDENAFIL CITRATE 100 MG PO TABS: 100.0000 mg | ORAL_TABLET | ORAL | 5 refills | Status: AC | PRN

## 2021-06-18 MED ORDER — ALPRAZOLAM 0.5 MG PO TABS: 0.5000 mg | ORAL_TABLET | Freq: Four times a day (QID) | ORAL | 5 refills | Status: DC | PRN

## 2021-06-18 NOTE — Progress Notes (Signed)
Subjective:    Patient ID: Isaac Adams, male    DOB: Jul 25, 1976, 45 y.o.   MRN: [id number]  HPI Here for a well exam. He feels well but he asks about erection difficulties. He has some trouble both obtaining and maintaining erections, though his libido is intact. He also asks about screening for heart disease. His grandfather developed CAD while in his 73's, and this concerns him.    Review of Systems  Constitutional: Negative.   HENT: Negative.    Eyes: Negative.   Respiratory: Negative.    Cardiovascular: Negative.   Gastrointestinal: Negative.   Genitourinary: Negative.   Musculoskeletal: Negative.   Skin: Negative.   Neurological: Negative.   Psychiatric/Behavioral: Negative.        Objective:   Physical Exam Constitutional:      General: He is not in acute distress.    Appearance: Normal appearance. He is well-developed. He is not diaphoretic.  HENT:     Head: Normocephalic and atraumatic.     Right Ear: External ear normal.     Left Ear: External ear normal.     Nose: Nose normal.     Mouth/Throat:     Pharynx: No oropharyngeal exudate.  Eyes:     General: No scleral icterus.       Right eye: No discharge.        Left eye: No discharge.     Conjunctiva/sclera: Conjunctivae normal.     Pupils: Pupils are equal, round, and reactive to light.  Neck:     Thyroid: No thyromegaly.     Vascular: No JVD.     Trachea: No tracheal deviation.  Cardiovascular:     Rate and Rhythm: Normal rate and regular rhythm.     Heart sounds: Normal heart sounds. No murmur heard.   No friction rub. No gallop.     Comments: EKG today is normal  Pulmonary:     Effort: Pulmonary effort is normal. No respiratory distress.     Breath sounds: Normal breath sounds. No wheezing or rales.  Chest:     Chest wall: No tenderness.  Abdominal:     General: Bowel sounds are normal. There is no distension.     Palpations: Abdomen is soft. There is no mass.     Tenderness: There is no  abdominal tenderness. There is no guarding or rebound.  Genitourinary:    Penis: Normal. No tenderness.      Testes: Normal.  Musculoskeletal:        General: No tenderness. Normal range of motion.     Cervical back: Neck supple.  Lymphadenopathy:     Cervical: No cervical adenopathy.  Skin:    General: Skin is warm and dry.     Coloration: Skin is not pale.     Findings: No erythema or rash.  Neurological:     Mental Status: He is alert and oriented to person, place, and time.     Cranial Nerves: No cranial nerve deficit.     Motor: No abnormal muscle tone.     Coordination: Coordination normal.     Deep Tendon Reflexes: Reflexes are normal and symmetric. Reflexes normal.  Psychiatric:        Behavior: Behavior normal.        Thought Content: Thought content normal.        Judgment: Judgment normal.          Assessment & Plan:  Well exam. We discussed diet and exercise. We will  check a testosterone level today. He will try Viagra for the ED. He does not need any type of stress testing at this point but we will continue to monitor this.  Gershon Crane, MD

## 2021-11-05 ENCOUNTER — Encounter: Payer: Self-pay | Admitting: Family Medicine

## 2021-11-05 ENCOUNTER — Ambulatory Visit (INDEPENDENT_AMBULATORY_CARE_PROVIDER_SITE_OTHER): Payer: BC Managed Care – PPO | Admitting: Family Medicine

## 2021-11-05 VITALS — BP 110/76 | HR 80 | Temp 98.7°F | Wt 214.0 lb

## 2021-11-05 DIAGNOSIS — M67441 Ganglion, right hand: Secondary | ICD-10-CM

## 2021-11-05 DIAGNOSIS — S161XXA Strain of muscle, fascia and tendon at neck level, initial encounter: Secondary | ICD-10-CM

## 2021-11-05 DIAGNOSIS — K219 Gastro-esophageal reflux disease without esophagitis: Secondary | ICD-10-CM | POA: Diagnosis not present

## 2021-11-05 DIAGNOSIS — M7712 Lateral epicondylitis, left elbow: Secondary | ICD-10-CM

## 2021-11-05 MED ORDER — FAMOTIDINE 40 MG PO TABS: 40.0000 mg | ORAL_TABLET | Freq: Every evening | ORAL | 3 refills | Status: DC
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
## 2021-12-19 ENCOUNTER — Other Ambulatory Visit: Payer: Self-pay | Admitting: Family Medicine

## 2021-12-19 ENCOUNTER — Encounter: Payer: Self-pay | Admitting: Family Medicine

## 2021-12-20 NOTE — Telephone Encounter (Signed)
Pt LOV was on 11/05/2021 ?Last refill done on 06/18/2021 ?Please advise ?

## 2021-12-21 ENCOUNTER — Other Ambulatory Visit: Payer: Self-pay | Admitting: Family Medicine

## 2021-12-21 MED ORDER — ALPRAZOLAM 0.5 MG PO TABS: 0.5000 mg | ORAL_TABLET | Freq: Four times a day (QID) | ORAL | 5 refills | Status: DC | PRN

## 2021-12-21 NOTE — Telephone Encounter (Signed)
Done

## 2022-06-17 ENCOUNTER — Other Ambulatory Visit: Payer: Self-pay | Admitting: Family Medicine

## 2022-06-27 ENCOUNTER — Ambulatory Visit: Payer: BC Managed Care – PPO | Admitting: Family Medicine

## 2022-06-27 ENCOUNTER — Ambulatory Visit (INDEPENDENT_AMBULATORY_CARE_PROVIDER_SITE_OTHER): Payer: BC Managed Care – PPO | Admitting: Family Medicine

## 2022-06-27 ENCOUNTER — Encounter: Payer: Self-pay | Admitting: Family Medicine

## 2022-06-27 VITALS — BP 120/78 | HR 73 | Temp 98.4°F | Ht 71.0 in | Wt 212.2 lb

## 2022-06-27 DIAGNOSIS — H5789 Other specified disorders of eye and adnexa: Secondary | ICD-10-CM

## 2022-06-27 MED ORDER — AMOXICILLIN-POT CLAVULANATE 875-125 MG PO TABS: 1.0000 | ORAL_TABLET | Freq: Two times a day (BID) | ORAL | 0 refills | Status: AC

## 2022-06-27 MED ORDER — POLYMYXIN B-TRIMETHOPRIM 10000-0.1 UNIT/ML-% OP SOLN: 1.0000 [drp] | OPHTHALMIC | 0 refills | Status: AC

## 2022-06-27 NOTE — Progress Notes (Signed)
Acute Office Visit  Subjective:     Patient ID: Isaac Adams, male    DOB: 17-May-1976, 46 y.o.   MRN: [id number]  Chief Complaint  Patient presents with   right eye bump    Now its turned into a line a few days ago.  Just got back from [place] and [place]     Sore Throat    And run down -sat covid neg      Patient is in today for eye irritation.  Patient reports he just recently got back from [place] trip. While there he started noticing a small, red, tender bump to right eyebrow. States that it seemed like an ingrown hair or pimple and resolved on its own in a few day. Since being home for the past week he was just monitoring it and this morning he noticed a new redness and inflamed "streak" from the bump area down to the inner canthus. He also noticed some purulent drainage from his eye this morning. He denies any vision changes, pain with EOM, spread of erythema or inflammation, fevers, chills. He reports he does feel like he has a cold and/or jet lag, but otherwise feels fine. He did have a negative COVID test 3 days ago.   ROS All review of systems negative except what is listed in the HPI      Objective:    BP 120/78   Pulse 73   Temp 98.4 F (36.9 C) (Oral)   Ht 5\' 11"  (1.803 m)   Wt 212 lb 3.2 oz (96.3 kg)   SpO2 98%   BMI 29.60 kg/m    Physical Exam Vitals reviewed.  Constitutional:      Appearance: He is well-developed.  HENT:     Head: Normocephalic and atraumatic.     Nose: No congestion or rhinorrhea.     Mouth/Throat:     Mouth: Mucous membranes are moist.     Pharynx: Oropharynx is clear.     Tonsils: No tonsillar exudate or tonsillar abscesses.  Eyes:     General:        Right eye: No discharge.     Conjunctiva/sclera: Conjunctivae normal.     Pupils: Pupils are equal, round, and reactive to light.   Neurological:     Mental Status: He is alert.        No results found for any visits on 06/27/22.      Assessment & Plan:    Problem List Items Addressed This Visit   None Visit Diagnoses     Irritation of right eye    -  Primary Dr. 06/29/22 evaluated for second opinion.  Unsure etiology, but will cover with ABX given close proximity to the eye.  Patient aware of signs/symptoms requiring further/urgent evaluation.     Relevant Medications   amoxicillin-clavulanate (AUGMENTIN) 875-125 MG tablet   trimethoprim-polymyxin b (POLYTRIM) ophthalmic solution       Meds ordered this encounter  Medications   amoxicillin-clavulanate (AUGMENTIN) 875-125 MG tablet    Sig: Take 1 tablet by mouth 2 (two) times daily for 7 days.    Dispense:  14 tablet    Refill:  0    Order Specific Question:   Supervising Provider    Answer:   Patsy Lager A [[id number]]   trimethoprim-polymyxin b (POLYTRIM) ophthalmic solution    Sig: Place 1 drop into the right eye every 4 (four) hours for 7 days.    Dispense:  10 mL  Refill:  0    Order Specific Question:   Supervising Provider    Answer:   Penni Homans A [id number]    Return if symptoms worsen or fail to improve.  Terrilyn Saver, NP

## 2022-08-16 ENCOUNTER — Ambulatory Visit (INDEPENDENT_AMBULATORY_CARE_PROVIDER_SITE_OTHER): Payer: BC Managed Care – PPO

## 2022-08-16 ENCOUNTER — Encounter: Payer: Self-pay | Admitting: Family Medicine

## 2022-08-16 ENCOUNTER — Ambulatory Visit (INDEPENDENT_AMBULATORY_CARE_PROVIDER_SITE_OTHER): Payer: BC Managed Care – PPO | Admitting: Family Medicine

## 2022-08-16 VITALS — BP 110/90 | HR 75 | Temp 98.4°F | Ht 71.5 in | Wt 209.6 lb

## 2022-08-16 DIAGNOSIS — J342 Deviated nasal septum: Secondary | ICD-10-CM | POA: Diagnosis not present

## 2022-08-16 DIAGNOSIS — Z0000 Encounter for general adult medical examination without abnormal findings: Secondary | ICD-10-CM | POA: Diagnosis not present

## 2022-08-16 DIAGNOSIS — Z8249 Family history of ischemic heart disease and other diseases of the circulatory system: Secondary | ICD-10-CM | POA: Insufficient documentation

## 2022-08-16 DIAGNOSIS — M542 Cervicalgia: Secondary | ICD-10-CM

## 2022-08-16 DIAGNOSIS — L309 Dermatitis, unspecified: Secondary | ICD-10-CM | POA: Insufficient documentation

## 2022-08-16 DIAGNOSIS — G8929 Other chronic pain: Secondary | ICD-10-CM | POA: Diagnosis not present

## 2022-08-16 LAB — BASIC METABOLIC PANEL
BUN: 12 mg/dL (ref 6–23)
CO2: 29 mEq/L (ref 19–32)
Calcium: 9.3 mg/dL (ref 8.4–10.5)
Chloride: 102 mEq/L (ref 96–112)
Creatinine, Ser: 1.13 mg/dL (ref 0.40–1.50)
GFR: 79.24 mL/min (ref >60.00)
Glucose, Bld: 99 mg/dL (ref 70–99)
Potassium: 4.2 mEq/L (ref 3.5–5.1)
Sodium: 140 mEq/L (ref 135–145)

## 2022-08-16 LAB — CBC WITH DIFFERENTIAL/PLATELET
Basophils Absolute: 0.1 10*3/uL (ref 0.0–0.1)
Basophils Relative: 0.8 % (ref 0.0–3.0)
Eosinophils Absolute: 0.3 10*3/uL (ref 0.0–0.7)
Eosinophils Relative: 3.9 % (ref 0.0–5.0)
HCT: 46.3 % (ref 39.0–52.0)
Hemoglobin: 16.1 g/dL (ref 13.0–17.0)
Lymphocytes Relative: 39.2 % (ref 12.0–46.0)
Lymphs Abs: 3.1 10*3/uL (ref 0.7–4.0)
MCHC: 34.7 g/dL (ref 30.0–36.0)
MCV: 89 fl (ref 78.0–100.0)
Monocytes Absolute: 0.6 10*3/uL (ref 0.1–1.0)
Monocytes Relative: 7.9 % (ref 3.0–12.0)
Neutro Abs: 3.9 10*3/uL (ref 1.4–7.7)
Neutrophils Relative %: 48.2 % (ref 43.0–77.0)
Platelets: 350 10*3/uL (ref 150.0–400.0)
RBC: 5.21 Mil/uL (ref 4.22–5.81)
RDW: 13.3 % (ref 11.5–15.5)
WBC: 8 10*3/uL (ref 4.0–10.5)

## 2022-08-16 LAB — LIPID PANEL
Cholesterol: 249 mg/dL — ABNORMAL HIGH (ref 0–200)
HDL: 40.3 mg/dL (ref >39.00)
NonHDL: 208.27
Total CHOL/HDL Ratio: 6
Triglycerides: 233 mg/dL — ABNORMAL HIGH (ref 0.0–149.0)
VLDL: 46.6 mg/dL — ABNORMAL HIGH (ref 0.0–40.0)

## 2022-08-16 LAB — HEPATIC FUNCTION PANEL
ALT: 20 U/L (ref 0–53)
AST: 18 U/L (ref 0–37)
Albumin: 4.5 g/dL (ref 3.5–5.2)
Alkaline Phosphatase: 53 U/L (ref 39–117)
Bilirubin, Direct: 0.1 mg/dL (ref 0.0–0.3)
Total Bilirubin: 0.8 mg/dL (ref 0.2–1.2)
Total Protein: 7.7 g/dL (ref 6.0–8.3)

## 2022-08-16 LAB — TSH: TSH: 3.17 u[IU]/mL (ref 0.35–5.50)

## 2022-08-16 LAB — TESTOSTERONE: Testosterone: 365.46 ng/dL (ref 300.00–890.00)

## 2022-08-16 LAB — HEMOGLOBIN A1C: Hgb A1c MFr Bld: 5.8 % (ref 4.6–6.5)

## 2022-08-16 LAB — LDL CHOLESTEROL, DIRECT: Direct LDL: 178 mg/dL

## 2022-08-16 MED ORDER — ATORVASTATIN CALCIUM 20 MG PO TABS: 20.0000 mg | ORAL_TABLET | Freq: Every day | ORAL | 3 refills | Status: DC

## 2022-08-16 MED ORDER — TRIAMCINOLONE ACETONIDE 0.1 % EX CREA: 1.0000 | TOPICAL_CREAM | Freq: Two times a day (BID) | CUTANEOUS | 3 refills | Status: AC

## 2022-08-16 NOTE — Progress Notes (Signed)
Subjective:    Patient ID: Isaac Adams, male    DOB: 1976-05-16, 47 y.o.   MRN: [id number]  HPI Here for a well exam. He has a few issues to discuss. First he asks to see a Cardiologist because of a family hx of heart disease. His father developed coronary artery disease in his early 64's and his grandfather had a heart attack in his 12's. Rush denies any chest pain or SOB. Also he asks about surgery to repair a deviated septum. He has trouble breathing through his nose and he snores a lot. He describes an itchy rash that comes and goes on his hands and lower legs. He also mentions chronic stiffness and occasional pain in his neck. He has known scoliosis in the lumbar spine.    Review of Systems  Constitutional: Negative.   HENT: Negative.    Eyes: Negative.   Respiratory: Negative.    Cardiovascular: Negative.   Gastrointestinal: Negative.   Genitourinary: Negative.   Musculoskeletal:  Positive for neck pain and neck stiffness.  Skin: Negative.   Neurological: Negative.   Psychiatric/Behavioral: Negative.         Objective:   Physical Exam Constitutional:      General: He is not in acute distress.    Appearance: Normal appearance. He is well-developed. He is not diaphoretic.  HENT:     Head: Normocephalic and atraumatic.     Right Ear: External ear normal.     Left Ear: External ear normal.     Nose:     Comments: His nasal septum is deviated to the left     Mouth/Throat:     Pharynx: Oropharynx is clear. No oropharyngeal exudate.  Eyes:     General: No scleral icterus.       Right eye: No discharge.        Left eye: No discharge.     Conjunctiva/sclera: Conjunctivae normal.     Pupils: Pupils are equal, round, and reactive to light.  Neck:     Thyroid: No thyromegaly.     Vascular: No JVD.     Trachea: No tracheal deviation.  Cardiovascular:     Rate and Rhythm: Normal rate and regular rhythm.     Heart sounds: Normal heart sounds. No murmur heard.    No  friction rub. No gallop.  Pulmonary:     Effort: Pulmonary effort is normal. No respiratory distress.     Breath sounds: Normal breath sounds. No wheezing or rales.  Chest:     Chest wall: No tenderness.  Abdominal:     General: Bowel sounds are normal. There is no distension.     Palpations: Abdomen is soft. There is no mass.     Tenderness: There is no abdominal tenderness. There is no guarding or rebound.  Genitourinary:    Penis: Normal. No tenderness.      Testes: Normal.     Prostate: Normal.     Rectum: Normal. Guaiac result negative.  Musculoskeletal:        General: No tenderness. Normal range of motion.     Cervical back: Neck supple.  Lymphadenopathy:     Cervical: No cervical adenopathy.  Skin:    General: Skin is warm and dry.     Coloration: Skin is not pale.     Findings: No erythema or rash.     Comments: There is a patch of macular scaly skin on the right palm   Neurological:  Mental Status: He is alert and oriented to person, place, and time.     Cranial Nerves: No cranial nerve deficit.     Motor: No abnormal muscle tone.     Coordination: Coordination normal.     Deep Tendon Reflexes: Reflexes are normal and symmetric. Reflexes normal.  Psychiatric:        Behavior: Behavior normal.        Thought Content: Thought content normal.        Judgment: Judgment normal.           Assessment & Plan:  Well exam. We discussed diet and exercise. Get fasting labs. He has eczema, so he can apply Triamcinolone cream as needed. For the neck pain, we will get Xrays of his cervical spine. For the deviated spetum, we will refer him to ENT. For the family hx of heart disease, we will refer him to Cardiology.  Gershon Crane, MD

## 2022-08-29 NOTE — Progress Notes (Signed)
CARDIOLOGY CONSULT NOTE       Patient ID: Isaac Adams MRN: [id number] DOB/AGE: 1975/10/04 47 y.o.  Admit date: (Not on file) Referring Physician: Sarajane Jews Primary Physician: Laurey Morale, MD Primary Cardiologist: New Reason for Consultation: Family history premature CAD   HPI:  47 y.o. referred by Dr Sarajane Jews at patient request for concern of family history Father had CAD in 31's and grandfather with MI in 66's He has no chest pain CRF;s HLD on statin Lipitor dose increased recently to 40 mg daily for LDL  very elevated  178 with likely familial HLD   Fathe had MI in 40's and grandfather as well. 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 Married with two kids Originally from [place]. Notes seeing a heart doctor when young for murmur and having "sonogram"  ROS All other systems reviewed and negative except as noted above  Past Medical History:  Diagnosis Date   Abdominal pain, left lower quadrant    Acute bronchitis    GERD (gastroesophageal reflux disease)    Heart murmur    as a child, resolved    Inguinal hernia    Lt groin pain    occasional     Family History  Problem Relation Age of Onset   Ovarian cancer Maternal Grandmother    Lung cancer Maternal Uncle     Social History   Socioeconomic History   Marital status: Married    Spouse name: Not on file   Number of children: Not on file   Years of education: Not on file   Highest education level: Not on file  Occupational History   Not on file  Tobacco Use   Smoking status: Former    Types: Cigarettes    Quit date: 2021    Years since quitting: 3.0   Smokeless tobacco: Never  Vaping Use   Vaping Use: Never used  Substance and Sexual Activity   Alcohol use: Yes   Drug use: No   Sexual activity: Yes    Partners: Female  Other Topics Concern   Not on file  Social History Narrative   Not on file   Social Determinants of Health   Financial Resource Strain: Not on file  Food Insecurity: Not on file   Transportation Needs: Not on file  Physical Activity: Not on file  Stress: Not on file  Social Connections: Not on file  Intimate Partner Violence: Not on file    Past Surgical History:  Procedure Laterality Date   WISDOM TOOTH EXTRACTION        Current Outpatient Medications:    ALPRAZolam (XANAX) 0.5 MG tablet, Take 1 tablet (0.5 mg total) by mouth every 6 (six) hours as needed. for anxiety, Disp: 60 tablet, Rfl: 5   atorvastatin (LIPITOR) 20 MG tablet, Take 1 tablet (20 mg total) by mouth daily., Disp: 90 tablet, Rfl: 3   EPINEPHrine (EPIPEN 2-PAK) 0.3 mg/0.3 mL IJ SOAJ injection, Inject 0.3 mLs (0.3 mg total) into the muscle as needed for anaphylaxis., Disp: 1 each, Rfl: 2   esomeprazole (NEXIUM) 40 MG capsule, TAKE ONE CAPSULE BY MOUTH DAILY, Disp: 90 capsule, Rfl: 3   famotidine (PEPCID) 40 MG tablet, Take 1 tablet (40 mg total) by mouth every evening., Disp: 90 tablet, Rfl: 3   sildenafil (VIAGRA) 100 MG tablet, Take 1 tablet (100 mg total) by mouth as needed for erectile dysfunction., Disp: 10 tablet, Rfl: 5   triamcinolone cream (KENALOG) 0.1 %, Apply 1 Application topically 2 (two)  times daily., Disp: 45 g, Rfl: 3    Physical Exam: Blood pressure 110/86, pulse 86, height 5\' 11"  (1.803 m), weight 205 lb (93 kg).   Affect appropriate Healthy:  appears stated age 22: normal Neck supple with no adenopathy JVP normal no bruits no thyromegaly Lungs clear with no wheezing and good diaphragmatic motion Heart:  S1/S2 1/6 SEM murmur, no rub, gallop or click PMI normal Abdomen: benighn, BS positve, no tenderness, no AAA no bruit.  No HSM or HJR Distal pulses intact with no bruits No edema Neuro non-focal Skin warm and dry No muscular weakness   Labs:   Lab Results  Component Value Date   WBC 8.0 08/16/2022   HGB 16.1 08/16/2022   HCT 46.3 08/16/2022   MCV 89.0 08/16/2022   PLT 350.0 08/16/2022   No results for input(s): "NA", "K", "CL", "CO2", "BUN",  "CREATININE", "CALCIUM", "PROT", "BILITOT", "ALKPHOS", "ALT", "AST", "GLUCOSE" in the last 168 hours.  Invalid input(s): "LABALBU" No results found for: "CKTOTAL", "CKMB", "CKMBINDEX", "TROPONINI"  Lab Results  Component Value Date   CHOL 249 (H) 08/16/2022   CHOL 168 06/03/2021   CHOL 195 04/16/2020   Lab Results  Component Value Date   HDL 40.30 08/16/2022   HDL 36.40 (L) 06/03/2021   HDL 47 04/16/2020   Lab Results  Component Value Date   LDLCALC 96 06/03/2021   LDLCALC 127 (H) 04/16/2020   Lab Results  Component Value Date   TRIG 233.0 (H) 08/16/2022   TRIG 180.0 (H) 06/03/2021   TRIG 104 04/16/2020   Lab Results  Component Value Date   CHOLHDL 6 08/16/2022   CHOLHDL 5 06/03/2021   CHOLHDL 4.1 04/16/2020   Lab Results  Component Value Date   LDLDIRECT 178.0 08/16/2022   LDLDIRECT 156.0 01/29/2018   LDLDIRECT 161.0 01/05/2017      Radiology: DG Cervical Spine Complete  Result Date: 08/16/2022 CLINICAL DATA:  Neck pain. EXAM: CERVICAL SPINE - COMPLETE 4+ VIEW COMPARISON:  None Available. FINDINGS: There is no evidence of cervical spine fracture or prevertebral soft tissue swelling. Straightening of cervical spine is noted. Degenerative joint changes with mild narrowed joint space and anterior spurring identified at C6-7. Narrowing of bilateral C6-7 neural foramina, right C3-4 neural foramina identified. IMPRESSION: Degenerative joint changes of cervical spine as described. Electronically Signed   By: Abelardo Diesel M.D.   On: 08/16/2022 14:13    EKG: SR rate 86 normal ECG    ASSESSMENT AND PLAN:   Family History of premature CAD:  with likely familial HLD given LDL 178 on 20 mg of lipitor. Does increased to 40 mg will repeat lipids and check ApoA and NMR in March and refer to Dr Debara Pickett as he likely needs to be on PSK9 with low dose statin Discussed utility of calcium score to further risk stratify for CAD  Murmur:  1/6 SEM benign given prior history will order  echo   Calcium Score Echo Lipitor 40 mg daily   F/U Dr Debara Pickett for Lipids March 2024  F/U me in a year   Signed: Jenkins Rouge 09/07/2022, 4:12 PM

## 2022-09-07 ENCOUNTER — Encounter: Payer: Self-pay | Admitting: Cardiovascular Disease

## 2022-09-07 ENCOUNTER — Ambulatory Visit: Payer: BC Managed Care – PPO | Attending: Cardiovascular Disease | Admitting: Cardiovascular Disease

## 2022-09-07 VITALS — BP 110/86 | HR 86 | Ht 71.0 in | Wt 205.0 lb

## 2022-09-07 DIAGNOSIS — E785 Hyperlipidemia, unspecified: Secondary | ICD-10-CM

## 2022-09-07 DIAGNOSIS — R011 Cardiac murmur, unspecified: Secondary | ICD-10-CM | POA: Diagnosis not present

## 2022-09-07 DIAGNOSIS — Z8249 Family history of ischemic heart disease and other diseases of the circulatory system: Secondary | ICD-10-CM | POA: Diagnosis not present

## 2022-09-07 NOTE — Patient Instructions (Addendum)
Medication Instructions:  Your physician recommends that you continue on your current medications as directed. Please refer to the Current Medication list given to you today.  *If you need a refill on your cardiac medications before your next appointment, please call your pharmacy*  Lab Work: If you have labs (blood work) drawn today and your tests are completely normal, you will receive your results only by: [place] (if you have MyChart) OR A paper copy in the mail If you have any lab test that is abnormal or we need to change your treatment, we will call you to review the results.  Testing/Procedures: Cardiac CT scanning for calcium score (CAT scanning), is a noninvasive, special x-ray that produces cross-sectional images of the body using x-rays and a computer. CT scans help physicians diagnose and treat medical conditions. For some CT exams, a contrast material is used to enhance visibility in the area of the body being studied. CT scans provide greater clarity and reveal more details than regular x-ray exams.  Your physician has requested that you have an echocardiogram. Echocardiography is a painless test that uses sound waves to create images of your heart. It provides your doctor with information about the size and shape of your heart and how well your heart's chambers and valves are working. This procedure takes approximately one hour. There are no restrictions for this procedure. Please do NOT wear cologne, perfume, aftershave, or lotions (deodorant is allowed). Please arrive 15 minutes prior to your appointment time.  Follow-Up: At [facility], you and your health needs are our priority.  As part of our continuing mission to provide you with exceptional heart care, we have created designated Provider Care Teams.  These Care Teams include your primary Cardiologist (physician) and Advanced Practice Providers (APPs -  Physician Assistants and Nurse Practitioners) who all  work together to provide you with the care you need, when you need it.  We recommend signing up for the patient portal called "MyChart".  Sign up information is provided on this After Visit Summary.  MyChart is used to connect with patients for Virtual Visits (Telemedicine).  Patients are able to view lab/test results, encounter notes, upcoming appointments, etc.  Non-urgent messages can be sent to your provider as well.   To learn more about what you can do with MyChart, go to [email or web address].    Your next appointment:   After test are complete  Provider:   Jenkins Rouge, MD     You have been referred to Dr. Debara Pickett for [facility].

## 2022-09-08 ENCOUNTER — Encounter: Payer: Self-pay | Admitting: Cardiovascular Disease

## 2022-09-19 ENCOUNTER — Ambulatory Visit (HOSPITAL_BASED_OUTPATIENT_CLINIC_OR_DEPARTMENT_OTHER)
Admission: RE | Admit: 2022-09-19 | Discharge: 2022-09-19 | Disposition: A | Discharge status: Home / Self Care | Payer: BC Managed Care – PPO | Source: Ambulatory Visit | Attending: Cardiovascular Disease | Admitting: Cardiovascular Disease

## 2022-09-19 DIAGNOSIS — E785 Hyperlipidemia, unspecified: Secondary | ICD-10-CM | POA: Insufficient documentation

## 2022-09-19 DIAGNOSIS — R011 Cardiac murmur, unspecified: Secondary | ICD-10-CM | POA: Insufficient documentation

## 2022-09-19 DIAGNOSIS — Z8249 Family history of ischemic heart disease and other diseases of the circulatory system: Secondary | ICD-10-CM

## 2022-09-29 ENCOUNTER — Ambulatory Visit (HOSPITAL_COMMUNITY): Payer: BC Managed Care – PPO | Attending: Cardiology

## 2022-09-29 DIAGNOSIS — E785 Hyperlipidemia, unspecified: Secondary | ICD-10-CM

## 2022-09-29 DIAGNOSIS — R011 Cardiac murmur, unspecified: Secondary | ICD-10-CM | POA: Diagnosis not present

## 2022-09-29 DIAGNOSIS — Z8249 Family history of ischemic heart disease and other diseases of the circulatory system: Secondary | ICD-10-CM | POA: Diagnosis not present

## 2022-09-29 LAB — ECHOCARDIOGRAM COMPLETE
Area-P 1/2: 4.36 cm2
S' Lateral: 3 cm

## 2022-10-06 ENCOUNTER — Telehealth: Payer: Self-pay | Admitting: Cardiovascular Disease

## 2022-10-06 NOTE — Telephone Encounter (Signed)
Per Dr. Johnsie Cancel, EF normal just a little calcium on AV no significant valve abnormalities good. Patient aware of results.

## 2022-10-06 NOTE — Telephone Encounter (Signed)
Patient returned call for his echo results.

## 2022-10-24 NOTE — Progress Notes (Signed)
CARDIOLOGY CONSULT NOTE       Patient ID: Isaac Adams MRN: [id number] DOB/AGE: 01/26/1976 47 y.o.  Admit date: (Not on file) Referring Physician: Sarajane Jews Primary Physician: Laurey Morale, MD Primary Cardiologist: Johnsie Cancel Reason for Consultation: Family history premature CAD   HPI:  47 y.o. 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 Notes seeing a heart doctor when young for murmur and having "sonogram"  TTE 09/29/22 EF 60-65% mild AV sclerosis  Calcium Score 09/23/22 was great at 9  Doing well has not started 40 mg dose of lipitor yet Wife and kids in [place] for spring break  ROS All other systems reviewed and negative except as noted above  Past Medical History:  Diagnosis Date   Abdominal pain, left lower quadrant    Acute bronchitis    GERD (gastroesophageal reflux disease)    Heart murmur    as a child, resolved    Inguinal hernia    Lt groin pain    occasional     Family History  Problem Relation Age of Onset   Ovarian cancer Maternal Grandmother    Lung cancer Maternal Uncle     Social History   Socioeconomic History   Marital status: Married    Spouse name: Not on file   Number of children: Not on file   Years of education: Not on file   Highest education level: Not on file  Occupational History   Not on file  Tobacco Use   Smoking status: Former    Types: Cigarettes    Quit date: 2021    Years since quitting: 3.2   Smokeless tobacco: Never  Vaping Use   Vaping Use: Never used  Substance and Sexual Activity   Alcohol use: Yes   Drug use: No   Sexual activity: Yes    Partners: Female  Other Topics Concern   Not on file   Social History Narrative   Not on file   Social Determinants of Health   Financial Resource Strain: Not on file  Food Insecurity: Not on file  Transportation Needs: Not on file  Physical Activity: Not on file  Stress: Not on file  Social Connections: Not on file  Intimate Partner Violence: Not on file    Past Surgical History:  Procedure Laterality Date   WISDOM TOOTH EXTRACTION        Current Outpatient Medications:    ALPRAZolam (XANAX) 0.5 MG tablet, Take 1 tablet (0.5 mg total) by mouth every 6 (six) hours as needed. for anxiety, Disp: 60 tablet, Rfl: 5   atorvastatin (LIPITOR) 20 MG tablet, Take 1 tablet (20 mg total) by mouth daily., Disp: 90 tablet, Rfl: 3   EPINEPHrine (EPIPEN 2-PAK) 0.3 mg/0.3 mL IJ SOAJ injection, Inject 0.3 mLs (0.3 mg total) into the muscle as needed for anaphylaxis., Disp: 1 each, Rfl: 2   esomeprazole (NEXIUM) 40 MG capsule, TAKE ONE CAPSULE BY MOUTH DAILY, Disp: 90 capsule, Rfl: 3   famotidine (PEPCID) 40 MG tablet, Take 1 tablet (40 mg total) by mouth every evening., Disp: 90 tablet, Rfl: 3   sildenafil (VIAGRA) 100 MG tablet,  Take 1 tablet (100 mg total) by mouth as needed for erectile dysfunction., Disp: 10 tablet, Rfl: 5   triamcinolone cream (KENALOG) 0.1 %, Apply 1 Application topically 2 (two) times daily., Disp: 45 g, Rfl: 3    Physical Exam: Blood pressure 100/80, pulse 90, height 5\' 11"  (1.803 m), weight 205 lb (93 kg), SpO2 98 %.   Affect appropriate Healthy:  appears stated age 47: normal Neck supple with no adenopathy JVP normal no bruits no thyromegaly Lungs clear with no wheezing and good diaphragmatic motion Heart:  S1/S2 1/6 SEM murmur, no rub, gallop or click PMI normal Abdomen: benighn, BS positve, no tenderness, no AAA no bruit.  No HSM or HJR Distal pulses intact with no bruits No edema Neuro non-focal Skin warm and dry No muscular weakness   Labs:   Lab Results  Component Value Date   WBC 8.0 08/16/2022    HGB 16.1 08/16/2022   HCT 46.3 08/16/2022   MCV 89.0 08/16/2022   PLT 350.0 08/16/2022   No results for input(s): "NA", "K", "CL", "CO2", "BUN", "CREATININE", "CALCIUM", "PROT", "BILITOT", "ALKPHOS", "ALT", "AST", "GLUCOSE" in the last 168 hours.  Invalid input(s): "LABALBU" No results found for: "CKTOTAL", "CKMB", "CKMBINDEX", "TROPONINI"  Lab Results  Component Value Date   CHOL 249 (H) 08/16/2022   CHOL 168 06/03/2021   CHOL 195 04/16/2020   Lab Results  Component Value Date   HDL 40.30 08/16/2022   HDL 36.40 (L) 06/03/2021   HDL 47 04/16/2020   Lab Results  Component Value Date   LDLCALC 96 06/03/2021   LDLCALC 127 (H) 04/16/2020   Lab Results  Component Value Date   TRIG 233.0 (H) 08/16/2022   TRIG 180.0 (H) 06/03/2021   TRIG 104 04/16/2020   Lab Results  Component Value Date   CHOLHDL 6 08/16/2022   CHOLHDL 5 06/03/2021   CHOLHDL 4.1 04/16/2020   Lab Results  Component Value Date   LDLDIRECT 178.0 08/16/2022   LDLDIRECT 156.0 01/29/2018   LDLDIRECT 161.0 01/05/2017      Radiology: No results found.  EKG: SR rate 86 normal ECG    ASSESSMENT AND PLAN:   Family History of premature CAD:  with likely familial HLD given LDL 178 on 20 mg of lipitor. Calcium score 0 observe Murmur:  1/6 SEM benign TTE 09/29/22 AV sclerosis mild HLD:  Lipitor dose increased to 40 mg daily LDL 178 prior to dose increase Repeat labs in April and refer to lipid clinic Dr Debara Pickett Despite calcium score 0 may consider PSK9 if LDL not < 100 on high dose statin. Also check NMR/Apo B and LpA   Lipid Liver June NMR Apo B, Lpa  F/U Dr Debara Pickett for Lipids    F/U me in a year    Signed: Jenkins Rouge 10/31/2022, 8:14 AM

## 2022-10-31 ENCOUNTER — Ambulatory Visit: Payer: BC Managed Care – PPO | Attending: Cardiovascular Disease | Admitting: Cardiovascular Disease

## 2022-10-31 ENCOUNTER — Encounter: Payer: Self-pay | Admitting: Cardiovascular Disease

## 2022-10-31 VITALS — BP 100/80 | HR 90 | Ht 71.0 in | Wt 205.0 lb

## 2022-10-31 DIAGNOSIS — Z8249 Family history of ischemic heart disease and other diseases of the circulatory system: Secondary | ICD-10-CM | POA: Diagnosis not present

## 2022-10-31 DIAGNOSIS — E785 Hyperlipidemia, unspecified: Secondary | ICD-10-CM

## 2022-10-31 DIAGNOSIS — R011 Cardiac murmur, unspecified: Secondary | ICD-10-CM

## 2022-10-31 MED ORDER — ATORVASTATIN CALCIUM 40 MG PO TABS: 40.0000 mg | ORAL_TABLET | Freq: Every day | ORAL | 3 refills | Status: DC

## 2022-10-31 NOTE — Patient Instructions (Signed)
Medication Instructions:  Your physician recommends that you continue on your current medications as directed. Please refer to the Current Medication list given to you today.  *If you need a refill on your cardiac medications before your next appointment, please call your pharmacy*  Lab Work: Your physician recommends that you have lab work today. Apo B, Lpa, NMR Your physician recommends that you return for lab work in: 3 months for fasting lipid and liver.  If you have labs (blood work) drawn today and your tests are completely normal, you will receive your results only by: [place] (if you have MyChart) OR A paper copy in the mail If you have any lab test that is abnormal or we need to change your treatment, we will call you to review the results.   Follow-Up: At [facility], you and your health needs are our priority.  As part of our continuing mission to provide you with exceptional heart care, we have created designated Provider Care Teams.  These Care Teams include your primary Cardiologist (physician) and Advanced Practice Providers (APPs -  Physician Assistants and Nurse Practitioners) who all work together to provide you with the care you need, when you need it.  We recommend signing up for the patient portal called "MyChart".  Sign up information is provided on this After Visit Summary.  MyChart is used to connect with patients for Virtual Visits (Telemedicine).  Patients are able to view lab/test results, encounter notes, upcoming appointments, etc.  Non-urgent messages can be sent to your provider as well.   To learn more about what you can do with MyChart, go to [email or web address].    Your next appointment:   1 year(s)  Provider:   Jenkins Rouge, MD

## 2022-10-31 NOTE — Addendum Note (Signed)
Addended by: Aris Georgia, Piercen Covino L on: 10/31/2022 08:29 AM   Modules accepted: Orders

## 2022-11-01 LAB — NMR, LIPOPROFILE
Cholesterol, Total: 190 mg/dL (ref 100–199)
HDL Particle Number: 26.9 umol/L — ABNORMAL LOW (ref >=30.5)
HDL-C: 41 mg/dL (ref >39)
LDL Particle Number: 1375 nmol/L — ABNORMAL HIGH (ref <1000)
LDL Size: 21 nm (ref >20.5)
LDL-C (NIH Calc): 119 mg/dL — ABNORMAL HIGH (ref 0–99)
LP-IR Score: 74 — ABNORMAL HIGH (ref <=45)
Small LDL Particle Number: 626 nmol/L — ABNORMAL HIGH (ref <=527)
Triglycerides: 170 mg/dL — ABNORMAL HIGH (ref 0–149)

## 2022-11-01 LAB — LIPOPROTEIN A (LPA): Lipoprotein (a): 298.5 nmol/L — ABNORMAL HIGH (ref <75.0)

## 2022-11-01 LAB — APOLIPOPROTEIN B: Apolipoprotein B: 105 mg/dL — ABNORMAL HIGH (ref <90)

## 2022-12-01 ENCOUNTER — Encounter: Payer: Self-pay | Admitting: Family Medicine

## 2022-12-01 DIAGNOSIS — Z91030 Bee allergy status: Secondary | ICD-10-CM

## 2022-12-02 ENCOUNTER — Other Ambulatory Visit: Payer: Self-pay

## 2022-12-08 NOTE — Telephone Encounter (Signed)
I did the referral 

## 2022-12-30 DIAGNOSIS — Z91030 Bee allergy status: Secondary | ICD-10-CM | POA: Diagnosis not present

## 2022-12-30 DIAGNOSIS — H1045 Other chronic allergic conjunctivitis: Secondary | ICD-10-CM | POA: Diagnosis not present

## 2022-12-30 DIAGNOSIS — J3089 Other allergic rhinitis: Secondary | ICD-10-CM | POA: Diagnosis not present

## 2023-01-18 ENCOUNTER — Ambulatory Visit: Payer: BC Managed Care – PPO | Attending: Cardiovascular Disease

## 2023-01-18 DIAGNOSIS — E785 Hyperlipidemia, unspecified: Secondary | ICD-10-CM | POA: Diagnosis not present

## 2023-01-18 DIAGNOSIS — Z8249 Family history of ischemic heart disease and other diseases of the circulatory system: Secondary | ICD-10-CM | POA: Diagnosis not present

## 2023-01-18 DIAGNOSIS — R011 Cardiac murmur, unspecified: Secondary | ICD-10-CM | POA: Diagnosis not present

## 2023-01-19 LAB — HEPATIC FUNCTION PANEL
ALT: 22 IU/L (ref 0–44)
AST: 18 IU/L (ref 0–40)
Albumin: 4.5 g/dL (ref 4.1–5.1)
Alkaline Phosphatase: 72 IU/L (ref 44–121)
Bilirubin Total: 0.6 mg/dL (ref 0.0–1.2)
Bilirubin, Direct: 0.16 mg/dL (ref 0.00–0.40)
Total Protein: 7.2 g/dL (ref 6.0–8.5)

## 2023-01-19 LAB — LIPID PANEL
Chol/HDL Ratio: 4.1 ratio (ref 0.0–5.0)
Cholesterol, Total: 206 mg/dL — ABNORMAL HIGH (ref 100–199)
HDL: 50 mg/dL (ref >39)
LDL Chol Calc (NIH): 135 mg/dL — ABNORMAL HIGH (ref 0–99)
Triglycerides: 117 mg/dL (ref 0–149)
VLDL Cholesterol Cal: 21 mg/dL (ref 5–40)

## 2023-03-07 ENCOUNTER — Encounter: Payer: Self-pay | Admitting: Family Medicine

## 2023-03-07 ENCOUNTER — Ambulatory Visit (INDEPENDENT_AMBULATORY_CARE_PROVIDER_SITE_OTHER): Payer: BC Managed Care – PPO | Admitting: Family Medicine

## 2023-03-07 VITALS — BP 106/78 | HR 85 | Temp 98.4°F | Wt 198.2 lb

## 2023-03-07 DIAGNOSIS — R5383 Other fatigue: Secondary | ICD-10-CM | POA: Diagnosis not present

## 2023-03-07 DIAGNOSIS — R739 Hyperglycemia, unspecified: Secondary | ICD-10-CM

## 2023-03-07 DIAGNOSIS — E538 Deficiency of other specified B group vitamins: Secondary | ICD-10-CM

## 2023-03-07 LAB — HEPATIC FUNCTION PANEL
ALT: 21 U/L (ref 0–53)
AST: 18 U/L (ref 0–37)
Albumin: 4.6 g/dL (ref 3.5–5.2)
Alkaline Phosphatase: 64 U/L (ref 39–117)
Bilirubin, Direct: 0.1 mg/dL (ref 0.0–0.3)
Total Bilirubin: 0.7 mg/dL (ref 0.2–1.2)
Total Protein: 7.5 g/dL (ref 6.0–8.3)

## 2023-03-07 LAB — BASIC METABOLIC PANEL
BUN: 15 mg/dL (ref 6–23)
CO2: 29 mEq/L (ref 19–32)
Calcium: 9.7 mg/dL (ref 8.4–10.5)
Chloride: 100 mEq/L (ref 96–112)
Creatinine, Ser: 1.24 mg/dL (ref 0.40–1.50)
GFR: 70.61 mL/min (ref >60.00)
Glucose, Bld: 102 mg/dL — ABNORMAL HIGH (ref 70–99)
Potassium: 4.7 mEq/L (ref 3.5–5.1)
Sodium: 137 mEq/L (ref 135–145)

## 2023-03-07 LAB — CBC WITH DIFFERENTIAL/PLATELET
Basophils Absolute: 0.1 10*3/uL (ref 0.0–0.1)
Basophils Relative: 0.9 % (ref 0.0–3.0)
Eosinophils Absolute: 0.2 10*3/uL (ref 0.0–0.7)
Eosinophils Relative: 3.3 % (ref 0.0–5.0)
HCT: 46.1 % (ref 39.0–52.0)
Hemoglobin: 15.7 g/dL (ref 13.0–17.0)
Lymphocytes Relative: 26.9 % (ref 12.0–46.0)
Lymphs Abs: 1.9 10*3/uL (ref 0.7–4.0)
MCHC: 34.1 g/dL (ref 30.0–36.0)
MCV: 89.5 fl (ref 78.0–100.0)
Monocytes Absolute: 0.5 10*3/uL (ref 0.1–1.0)
Monocytes Relative: 6.7 % (ref 3.0–12.0)
Neutro Abs: 4.5 10*3/uL (ref 1.4–7.7)
Neutrophils Relative %: 62.2 % (ref 43.0–77.0)
Platelets: 326 10*3/uL (ref 150.0–400.0)
RBC: 5.15 Mil/uL (ref 4.22–5.81)
RDW: 13.4 % (ref 11.5–15.5)
WBC: 7.2 10*3/uL (ref 4.0–10.5)

## 2023-03-07 LAB — TSH: TSH: 1.86 u[IU]/mL (ref 0.35–5.50)

## 2023-03-07 LAB — HEMOGLOBIN A1C: Hgb A1c MFr Bld: 5.7 % (ref 4.6–6.5)

## 2023-03-07 LAB — VITAMIN B12: Vitamin B-12: 305 pg/mL (ref 211–911)

## 2023-03-07 NOTE — Progress Notes (Signed)
   Subjective:    Patient ID: Isaac Adams, male    DOB: [birth year], 47 y.o.   MRN: [id number]  HPI Here to discuss feeling tired all the time. This started several months ago. He denies any particular stressors in his life. He sleeps well. He has lost about 10 lbs in the past 3 months without changing his diet. He tested negative for Covid last week.    Review of Systems  Constitutional:  Positive for fatigue and unexpected weight change.  Respiratory: Negative.    Cardiovascular: Negative.   Gastrointestinal: Negative.   Genitourinary: Negative.   Neurological: Negative.   Psychiatric/Behavioral: Negative.         Objective:   Physical Exam Constitutional:      Appearance: Normal appearance.  Cardiovascular:     Rate and Rhythm: Normal rate and regular rhythm.     Pulses: Normal pulses.     Heart sounds: Normal heart sounds.  Pulmonary:     Effort: Pulmonary effort is normal.     Breath sounds: Normal breath sounds.  Abdominal:     General: Abdomen is flat. Bowel sounds are normal. There is no distension.     Palpations: Abdomen is soft. There is no mass.     Tenderness: There is no abdominal tenderness. There is no guarding or rebound.     Hernia: No hernia is present.  Neurological:     General: No focal deficit present.     Mental Status: He is alert and oriented to person, place, and time.  Psychiatric:        Mood and Affect: Mood normal.        Behavior: Behavior normal.        Thought Content: Thought content normal.           Assessment & Plan:  Fatigue and slight weight loss. The etiology is uncertain. We will get labs today to check for anemia and other possible issues.  Gershon Crane, MD

## 2023-03-14 DIAGNOSIS — J029 Acute pharyngitis, unspecified: Secondary | ICD-10-CM | POA: Diagnosis not present

## 2023-04-04 ENCOUNTER — Encounter (HOSPITAL_BASED_OUTPATIENT_CLINIC_OR_DEPARTMENT_OTHER): Payer: Self-pay | Admitting: Internal Medicine

## 2023-04-04 ENCOUNTER — Ambulatory Visit (INDEPENDENT_AMBULATORY_CARE_PROVIDER_SITE_OTHER): Payer: BC Managed Care – PPO | Admitting: Internal Medicine

## 2023-04-04 VITALS — BP 119/74 | HR 82 | Ht 71.0 in | Wt 207.8 lb

## 2023-04-04 DIAGNOSIS — E785 Hyperlipidemia, unspecified: Secondary | ICD-10-CM | POA: Diagnosis not present

## 2023-04-04 DIAGNOSIS — Z8249 Family history of ischemic heart disease and other diseases of the circulatory system: Secondary | ICD-10-CM

## 2023-04-04 DIAGNOSIS — E7841 Elevated Lipoprotein(a): Secondary | ICD-10-CM

## 2023-04-04 NOTE — Patient Instructions (Signed)
Medication Instructions:  Continue current medications  *If you need a refill on your cardiac medications before your next appointment, please call your pharmacy*   Lab Work: None Ordered   Testing/Procedures: None Ordered   Follow-Up: At [facility], you and your health needs are our priority.  As part of our continuing mission to provide you with exceptional heart care, we have created designated Provider Care Teams.  These Care Teams include your primary Cardiologist (physician) and Advanced Practice Providers (APPs -  Physician Assistants and Nurse Practitioners) who all work together to provide you with the care you need, when you need it.  We recommend signing up for the patient portal called "MyChart".  Sign up information is provided on this After Visit Summary.  MyChart is used to connect with patients for Virtual Visits (Telemedicine).  Patients are able to view lab/test results, encounter notes, upcoming appointments, etc.  Non-urgent messages can be sent to your provider as well.   To learn more about what you can do with MyChart, go to [email or web address].    Your next appointment:   4 month(s)  Provider:   [place] K Hilty, MD  Other Instructions

## 2023-04-04 NOTE — Progress Notes (Signed)
LIPID CLINIC CONSULT NOTE  Chief Complaint:  Manage dyslipidemia  Primary Care Physician: Isaac Salisbury, MD  Primary Cardiologist:  Isaac Haws, MD  HPI:  Isaac Adams is a 47 y.o. male who is being seen today for the evaluation of dyslipidemia at the request of Isaac Stade, MD. this is a pleasant 47 year old male kindly referred for evaluation management of dyslipidemia.  He was initially seen due to a strong family history of heart disease both in his Isaac Adams and Isaac Adams.  He underwent a calcium score which was fortunately 0, but despite this his cholesterol has been high.  Goal was to target LDL less than 100 however he has been compliant on 40 mg of atorvastatin.  Additional lipid testing was performed in March 2024 which showed an ApoB of 105, significantly elevated LP(a) of 298.5 nmol/L, and an NMR which showed a particle #1375, LDL 119, HDL 41, triglycerides 170 and small LDL particle #626.  He reports a fairly healthy diet low in saturated fats and does get regular physical activity.  PMHx:  Past Medical History:  Diagnosis Date   Abdominal pain, left lower quadrant    Acute bronchitis    GERD (gastroesophageal reflux disease)    Heart murmur    as a child, resolved    Inguinal hernia    Lt groin pain    occasional     Past Surgical History:  Procedure Laterality Date   WISDOM TOOTH EXTRACTION      FAMHx:  Family History  Problem Relation Age of Onset   Isaac Adams    Isaac Adams     SOCHx:   reports that he quit smoking about 3 years ago. His smoking use included cigarettes. He has never used smokeless tobacco. He reports current alcohol use. He reports that he does not use drugs.  ALLERGIES:  No Known Allergies  ROS: Pertinent items noted in HPI and remainder of comprehensive ROS otherwise negative.  HOME MEDS: Current Outpatient Medications on File Prior to Visit  Medication Sig Dispense Refill    ALPRAZolam (XANAX) 0.5 MG tablet Take 1 tablet (0.5 mg total) by mouth every 6 (six) hours as needed. for anxiety 60 tablet 5   atorvastatin (LIPITOR) 40 MG tablet Take 1 tablet (40 mg total) by mouth daily. 90 tablet 3   EPINEPHrine (EPIPEN 2-PAK) 0.3 mg/0.3 mL IJ SOAJ injection Inject 0.3 mLs (0.3 mg total) into the muscle as needed for anaphylaxis. 1 each 2   esomeprazole (NEXIUM) 40 MG capsule TAKE ONE CAPSULE BY MOUTH DAILY 90 capsule 3   famotidine (PEPCID) 40 MG tablet Take 1 tablet (40 mg total) by mouth every evening. 90 tablet 3   sildenafil (VIAGRA) 100 MG tablet Take 1 tablet (100 mg total) by mouth as needed for erectile dysfunction. 10 tablet 5   triamcinolone cream (KENALOG) 0.1 % Apply 1 Application topically 2 (two) times daily. 45 g 3   No current facility-administered medications on file prior to visit.    LABS/IMAGING: No results found for this or any previous visit (from the past 48 hour(s)). No results found.  LIPID PANEL:    Component Value Date/Time   CHOL 206 (H) 01/18/2023 0733   TRIG 117 01/18/2023 0733   HDL 50 01/18/2023 0733   CHOLHDL 4.1 01/18/2023 0733   CHOLHDL 6 08/16/2022 0834   VLDL 46.6 (H) 08/16/2022 0834   LDLCALC 135 (H) 01/18/2023 0733   LDLCALC 127 (H) 04/16/2020  1051   LDLDIRECT 178.0 08/16/2022 0834    WEIGHTS: Wt Readings from Last 3 Encounters:  04/04/23 207 lb 12.8 oz (94.3 kg)  03/07/23 198 lb 3.2 oz (89.9 kg)  10/31/22 205 lb (93 kg)    VITALS: BP 119/74 (BP Location: Right Arm, Patient Position: Sitting, Cuff Size: Normal)   Pulse 82   Ht 5\' 11"  (1.803 m)   Wt 207 lb 12.8 oz (94.3 kg)   SpO2 96%   BMI 28.98 kg/m   EXAM: Deferred  EKG: Deferred  ASSESSMENT: Mixed dyslipidemia with high LP(a), goal LDL less than 100 LP(a) of 298.5 nmol/L Early onset heart disease in Isaac Adams and Isaac Adams 0 CAC score (09/2022)  PLAN: 1.   Isaac Adams has mixed dyslipidemia with a high LP(a).  Although he has no coronary calcium, at  his young age this does not necessarily suggest that he does not have noncalcified coronary artery disease.  Given his high LP(a) he is at higher risk for cardiovascular events and there is strong family history of early heart disease on the Isaac Adams side.  He is on a high intensity statin although his LDL remains above target.  He is a good candidate for PCSK9 inhibitor to reach a goal LDL less than 100 and this would also potentially give him some decrease in his LP(a).  We discussed options today including the antibody PCSK9 inhibitors (Repatha, Praluent) or Leqvio (SI RNA).  In addition, it may be beneficial to get genetic testing although he would likely have an LPA variation, he may have other abnormalities in his genome that increased cardiovascular risk.  He tells me he did have "23 and me" testing but that failed to identify any increased cardiovascular risk.  Is also suggested that his Isaac Adams get LP(a) testing as well.  He wishes to consider these testing options further and will reach out to me to see if he wishes to proceed with additional testing.  I encouraged him to reach out to me through MyChart.  We could potentially set him up for a nursing visit for genetic testing.  Thanks again for the kind referral.  Isaac Nose, MD, [facility]  [place]  [facility]  Medical Director of the [facility] &  Cardiovascular Risk Reduction Clinic Diplomate of the American Board of Clinical Lipidology Attending Cardiologist  Direct Dial: [phone]  Fax: [phone]  Website:  [email or web address]  Isaac Adams 04/04/2023, 3:50 PM

## 2023-06-16 ENCOUNTER — Other Ambulatory Visit: Payer: Self-pay | Admitting: Family Medicine

## 2023-06-19 ENCOUNTER — Encounter: Payer: Self-pay | Admitting: Family Medicine

## 2023-08-03 DIAGNOSIS — J209 Acute bronchitis, unspecified: Secondary | ICD-10-CM | POA: Diagnosis not present

## 2023-08-30 ENCOUNTER — Encounter: Payer: Self-pay | Admitting: Family Medicine

## 2023-08-30 ENCOUNTER — Ambulatory Visit (INDEPENDENT_AMBULATORY_CARE_PROVIDER_SITE_OTHER): Payer: BC Managed Care – PPO | Admitting: Family Medicine

## 2023-08-30 VITALS — BP 100/76 | HR 86 | Temp 98.6°F | Ht 71.0 in | Wt 209.0 lb

## 2023-08-30 DIAGNOSIS — Z0000 Encounter for general adult medical examination without abnormal findings: Secondary | ICD-10-CM | POA: Diagnosis not present

## 2023-08-30 LAB — CBC WITH DIFFERENTIAL/PLATELET
Basophils Absolute: 0.1 10*3/uL (ref 0.0–0.1)
Basophils Relative: 1 % (ref 0.0–3.0)
Eosinophils Absolute: 0.3 10*3/uL (ref 0.0–0.7)
Eosinophils Relative: 4.6 % (ref 0.0–5.0)
HCT: 45.9 % (ref 39.0–52.0)
Hemoglobin: 15.7 g/dL (ref 13.0–17.0)
Lymphocytes Relative: 37.2 % (ref 12.0–46.0)
Lymphs Abs: 2.4 10*3/uL (ref 0.7–4.0)
MCHC: 34.2 g/dL (ref 30.0–36.0)
MCV: 88.7 fL (ref 78.0–100.0)
Monocytes Absolute: 0.4 10*3/uL (ref 0.1–1.0)
Monocytes Relative: 6.2 % (ref 3.0–12.0)
Neutro Abs: 3.3 10*3/uL (ref 1.4–7.7)
Neutrophils Relative %: 51 % (ref 43.0–77.0)
Platelets: 316 10*3/uL (ref 150.0–400.0)
RBC: 5.17 Mil/uL (ref 4.22–5.81)
RDW: 12.9 % (ref 11.5–15.5)
WBC: 6.5 10*3/uL (ref 4.0–10.5)

## 2023-08-30 LAB — BASIC METABOLIC PANEL
BUN: 12 mg/dL (ref 6–23)
CO2: 30 meq/L (ref 19–32)
Calcium: 9.3 mg/dL (ref 8.4–10.5)
Chloride: 102 meq/L (ref 96–112)
Creatinine, Ser: 1.2 mg/dL (ref 0.40–1.50)
GFR: 73.2 mL/min (ref >60.00)
Glucose, Bld: 100 mg/dL — ABNORMAL HIGH (ref 70–99)
Potassium: 4.1 meq/L (ref 3.5–5.1)
Sodium: 140 meq/L (ref 135–145)

## 2023-08-30 LAB — HEPATIC FUNCTION PANEL
ALT: 23 U/L (ref 0–53)
AST: 16 U/L (ref 0–37)
Albumin: 4.7 g/dL (ref 3.5–5.2)
Alkaline Phosphatase: 62 U/L (ref 39–117)
Bilirubin, Direct: 0.2 mg/dL (ref 0.0–0.3)
Total Bilirubin: 0.9 mg/dL (ref 0.2–1.2)
Total Protein: 7.3 g/dL (ref 6.0–8.3)

## 2023-08-30 LAB — LIPID PANEL
Cholesterol: 156 mg/dL (ref 0–200)
HDL: 36.6 mg/dL — ABNORMAL LOW (ref >39.00)
LDL Cholesterol: 93 mg/dL (ref 0–99)
NonHDL: 119.33
Total CHOL/HDL Ratio: 4
Triglycerides: 132 mg/dL (ref 0.0–149.0)
VLDL: 26.4 mg/dL (ref 0.0–40.0)

## 2023-08-30 LAB — TSH: TSH: 1.44 u[IU]/mL (ref 0.35–5.50)

## 2023-08-30 LAB — HEMOGLOBIN A1C: Hgb A1c MFr Bld: 5.9 % (ref 4.6–6.5)

## 2023-08-30 MED ORDER — ALPRAZOLAM 0.5 MG PO TABS: 0.5000 mg | ORAL_TABLET | Freq: Four times a day (QID) | ORAL | 5 refills | Status: AC | PRN

## 2023-08-30 NOTE — Progress Notes (Signed)
   Subjective:    Patient ID: Isaac Adams, male    DOB: [birth year], 48 y.o.   MRN: [id number]  HPI Here for a well exam. He feels well.    Review of Systems  Constitutional: Negative.   HENT: Negative.    Eyes: Negative.   Respiratory: Negative.    Cardiovascular: Negative.   Gastrointestinal: Negative.   Genitourinary: Negative.   Musculoskeletal: Negative.   Skin: Negative.   Neurological: Negative.   Psychiatric/Behavioral: Negative.         Objective:   Physical Exam Constitutional:      General: He is not in acute distress.    Appearance: Normal appearance. He is well-developed. He is not diaphoretic.  HENT:     Head: Normocephalic and atraumatic.     Right Ear: External ear normal.     Left Ear: External ear normal.     Nose: Nose normal.     Mouth/Throat:     Pharynx: No oropharyngeal exudate.  Eyes:     General: No scleral icterus.       Right eye: No discharge.        Left eye: No discharge.     Conjunctiva/sclera: Conjunctivae normal.     Pupils: Pupils are equal, round, and reactive to light.  Neck:     Thyroid: No thyromegaly.     Vascular: No JVD.     Trachea: No tracheal deviation.  Cardiovascular:     Rate and Rhythm: Normal rate and regular rhythm.     Pulses: Normal pulses.     Heart sounds: Normal heart sounds. No murmur heard.    No friction rub. No gallop.  Pulmonary:     Effort: Pulmonary effort is normal. No respiratory distress.     Breath sounds: Normal breath sounds. No wheezing or rales.  Chest:     Chest wall: No tenderness.  Abdominal:     General: Bowel sounds are normal. There is no distension.     Palpations: Abdomen is soft. There is no mass.     Tenderness: There is no abdominal tenderness. There is no guarding or rebound.  Genitourinary:    Penis: Normal. No tenderness.      Testes: Normal.  Musculoskeletal:        General: No tenderness. Normal range of motion.     Cervical back: Neck supple.  Lymphadenopathy:      Cervical: No cervical adenopathy.  Skin:    General: Skin is warm and dry.     Coloration: Skin is not pale.     Findings: No erythema or rash.  Neurological:     General: No focal deficit present.     Mental Status: He is alert and oriented to person, place, and time.     Cranial Nerves: No cranial nerve deficit.     Motor: No abnormal muscle tone.     Coordination: Coordination normal.     Deep Tendon Reflexes: Reflexes are normal and symmetric. Reflexes normal.  Psychiatric:        Mood and Affect: Mood normal.        Behavior: Behavior normal.        Thought Content: Thought content normal.        Judgment: Judgment normal.           Assessment & Plan:  Well exam. We discussed diet and exercise. Get fasting labs. Gershon Crane, MD

## 2023-11-08 ENCOUNTER — Encounter: Payer: Self-pay | Admitting: Family Medicine

## 2023-11-08 DIAGNOSIS — R0683 Snoring: Secondary | ICD-10-CM

## 2023-11-10 ENCOUNTER — Encounter (INDEPENDENT_AMBULATORY_CARE_PROVIDER_SITE_OTHER): Payer: Self-pay | Admitting: Otolaryngology

## 2023-11-10 NOTE — Telephone Encounter (Signed)
I did the referral to ENT  

## 2023-11-30 ENCOUNTER — Other Ambulatory Visit: Payer: Self-pay | Admitting: Cardiovascular Disease

## 2024-01-02 ENCOUNTER — Encounter: Payer: Self-pay | Admitting: Family Medicine

## 2024-01-03 NOTE — Telephone Encounter (Signed)
 He should discuss this with his GI provider. In the meantime taking a probiotic like Align daily can help

## 2024-01-19 ENCOUNTER — Encounter (INDEPENDENT_AMBULATORY_CARE_PROVIDER_SITE_OTHER): Payer: Self-pay | Admitting: Otolaryngology

## 2024-01-19 ENCOUNTER — Ambulatory Visit (INDEPENDENT_AMBULATORY_CARE_PROVIDER_SITE_OTHER): Admitting: Otolaryngology

## 2024-01-19 VITALS — BP 129/83 | HR 81 | Ht 71.0 in | Wt 210.4 lb

## 2024-01-19 DIAGNOSIS — G4733 Obstructive sleep apnea (adult) (pediatric): Secondary | ICD-10-CM

## 2024-01-19 DIAGNOSIS — J343 Hypertrophy of nasal turbinates: Secondary | ICD-10-CM

## 2024-01-19 DIAGNOSIS — R0981 Nasal congestion: Secondary | ICD-10-CM

## 2024-01-19 DIAGNOSIS — R0683 Snoring: Secondary | ICD-10-CM

## 2024-01-19 DIAGNOSIS — R0982 Postnasal drip: Secondary | ICD-10-CM

## 2024-01-19 DIAGNOSIS — Z87891 Personal history of nicotine dependence: Secondary | ICD-10-CM

## 2024-01-19 DIAGNOSIS — J342 Deviated nasal septum: Secondary | ICD-10-CM | POA: Diagnosis not present

## 2024-01-19 DIAGNOSIS — J3489 Other specified disorders of nose and nasal sinuses: Secondary | ICD-10-CM | POA: Diagnosis not present

## 2024-01-19 DIAGNOSIS — J3089 Other allergic rhinitis: Secondary | ICD-10-CM

## 2024-01-19 MED ORDER — FLUTICASONE PROPIONATE 50 MCG/ACT NA SUSP: 2.0000 | Freq: Every day | NASAL | 6 refills | Status: AC

## 2024-01-19 NOTE — Progress Notes (Signed)
 ENT CONSULT:  Reason for Consult: snoring    HPI: Discussed the use of AI scribe software for clinical note transcription with the patient, who gave verbal consent to proceed.  History of Present Illness  Discussed the use of AI scribe software for clinical note transcription with the patient, who gave verbal consent to proceed.  History of Present Illness   Isaac Adams is a 48 year old male who presents with snoring issues and nasal obstruction.  He has been experiencing snoring issues, which have become a concern for his wife. Approximately ten years ago, he underwent a sleep apnea test conducted overnight in a medical facility, which indicated he did not have sleep apnea at that time. He occasionally wakes up in the middle of the night but generally feels rested during the day if he gets seven to eight hours of sleep.  He has difficulty breathing through his nose, noting that the left nostril functions better than the right. This nasal obstruction often leads to mouth breathing. He has been evaluated by an allergist who determined he has a slight dust allergy but no other allergies.  He uses Claritin or Zyrtec for allergy management but is concerned about withdrawal symptoms, describing a sensation of 'bumps crawling on my skin' if he misses doses for more than two and a half days. He wants to discontinue the medication due to these side effects.  He is open to undergoing another sleep study to reassess for sleep apnea, given the possibility of changes over time.     Records Reviewed:  Sent to us  by PCP due to snoring - no prior sleep studies    Past Medical History:  Diagnosis Date   Abdominal pain, left lower quadrant    Acute bronchitis    GERD (gastroesophageal reflux disease)    Heart murmur    as a child, resolved    Inguinal hernia    Lt groin pain    occasional     Past Surgical History:  Procedure Laterality Date   WISDOM TOOTH EXTRACTION      Family  History  Problem Relation Age of Onset   Ovarian cancer Maternal Grandmother    Lung cancer Maternal Uncle     Social History:  reports that he quit smoking about 4 years ago. His smoking use included cigarettes. He has never used smokeless tobacco. He reports current alcohol use. He reports that he does not use drugs.  Allergies: No Known Allergies  Medications: I have reviewed the patient's current medications.  The PMH, PSH, Medications, Allergies, and SH were reviewed and updated.  ROS: Constitutional: Negative for fever, weight loss and weight gain. Cardiovascular: Negative for chest pain and dyspnea on exertion. Respiratory: Is not experiencing shortness of breath at rest. Gastrointestinal: Negative for nausea and vomiting. Neurological: Negative for headaches. Psychiatric: The patient is not nervous/anxious  Blood pressure 129/83, pulse 81, height 5' 11 (1.803 m), weight 210 lb 6.4 oz (95.4 kg), SpO2 94%. Body mass index is 29.34 kg/m.  PHYSICAL EXAM:  Exam: General: Well-developed, well-nourished Respiratory Respiratory effort: Equal inspiration and expiration without stridor Cardiovascular Peripheral Vascular: Warm extremities with equal color/perfusion Eyes: No nystagmus with equal extraocular motion bilaterally Neuro/Psych/Balance: Patient oriented to person, place, and time; Appropriate mood and affect; Gait is intact with no imbalance; Cranial nerves I-XII are intact Head and Face Inspection: Normocephalic and atraumatic without mass or lesion Palpation: Facial skeleton intact without bony stepoffs Salivary Glands: No mass or tenderness Facial Strength:  Facial motility symmetric and full bilaterally ENT Pinna: External ear intact and fully developed External canal: Canal is patent with intact skin Tympanic Membrane: Clear and mobile External Nose: No scar or anatomic deformity Internal Nose: Septum is S-shaped and deviated L side more narrow than the right  No polyp, or purulence. Mucosal edema and erythema present.  Bilateral inferior turbinate hypertrophy.  Lips, Teeth, and gums: Mucosa and teeth intact and viable TMJ: No pain to palpation with full mobility Oral cavity/oropharynx: No erythema or exudate, no lesions present absent tonsils Friedman I  Nasopharynx: No mass or lesion with intact mucosa Neck Neck and Trachea: Midline trachea without mass or lesion Thyroid : No mass or nodularity Lymphatics: No lymphadenopathy  Procedure:   PROCEDURE NOTE: nasal endoscopy  Preoperative diagnosis: chronic nasal obstruction symptoms  Postoperative diagnosis: same  Procedure: Diagnostic nasal endoscopy (40981)  Surgeon: Artice Last, M.D.  Anesthesia: Topical lidocaine and Afrin  H&P REVIEW: The patient's history and physical were reviewed today prior to procedure. All medications were reviewed and updated as well. Complications: None Condition is stable throughout exam Indications and consent: The patient presents with symptoms of chronic sinusitis not responding to previous therapies. All the risks, benefits, and potential complications were reviewed with the patient preoperatively and informed consent was obtained. The time out was completed with confirmation of the correct procedure.   Procedure: The patient was seated upright in the clinic. Topical lidocaine and Afrin were applied to the nasal cavity. After adequate anesthesia had occurred, the rigid nasal endoscope was passed into the nasal cavity. The nasal mucosa, turbinates, septum, and sinus drainage pathways were visualized bilaterally. This revealed no purulence or significant secretions that might be cultured. There were no polyps or sites of significant inflammation. The mucosa was intact and there was no crusting present. The scope was then slowly withdrawn and the patient tolerated the procedure well. There were no complications or blood loss.    Assessment/Plan: Encounter  Diagnoses  Name Primary?   Snoring Yes   Obstructive sleep apnea    Chronic nasal congestion    Environmental and seasonal allergies    Hypertrophy of both inferior nasal turbinates    Nasal septal deviation    Post-nasal drip    Nasal obstruction     Assessment and Plan Assessment & Plan    Assessment and Plan    Chronic nasal congestion and nasal obstruction with Deviated nasal septum and ITH on nasal endoscopy but no polyps or pus.  Septal deviation causing left-sided nasal obstruction. Septoplasty considered if medical management fails. - Start Flonase for congestion. - Perform nasal saline rinses. - Consider septoplasty/ITR if symptoms persist.  Snoring concern for OSA Snoring possibly related to septal deviation or sleep apnea. Cannot rule out OSA. Previous sleep study negative, retesting may be needed since it was done 10 yrs ago - Refer to [facility] for sleep study consultation.   Chronic nasal congestion and post-nasal drainage Evidence of post-nasal drainage during flexible scope exam today, could be contributing to her sx - trial of Xyzal 5 mg daily and Flonase 2 puffs b/l nares BID - consider nasal saline rinses           Thank you for allowing me to participate in the care of this patient. Please do not hesitate to contact me with any questions or concerns.   Artice Last, MD Otolaryngology [facility] Phone: [phone] Fax: [phone]    01/19/2024, 8:38 AM

## 2024-03-03 ENCOUNTER — Other Ambulatory Visit: Payer: Self-pay | Admitting: Cardiovascular Disease

## 2024-04-09 ENCOUNTER — Telehealth (INDEPENDENT_AMBULATORY_CARE_PROVIDER_SITE_OTHER): Payer: Self-pay | Admitting: Otolaryngology

## 2024-04-09 NOTE — Telephone Encounter (Signed)
 Tried to leave VM to reschedule appointment - VM FULL

## 2024-04-16 ENCOUNTER — Encounter (INDEPENDENT_AMBULATORY_CARE_PROVIDER_SITE_OTHER): Payer: Self-pay

## 2024-04-16 ENCOUNTER — Telehealth (INDEPENDENT_AMBULATORY_CARE_PROVIDER_SITE_OTHER): Payer: Self-pay | Admitting: Otolaryngology

## 2024-04-16 NOTE — Telephone Encounter (Signed)
 Tried to call to reschedule 09/17 appointment - Dr. Okey not in office.  VM Full.  Sent MyChart message as well.

## 2024-04-18 ENCOUNTER — Ambulatory Visit: Admitting: Pulmonary Disease

## 2024-04-24 ENCOUNTER — Ambulatory Visit (INDEPENDENT_AMBULATORY_CARE_PROVIDER_SITE_OTHER): Admitting: Otolaryngology

## 2024-05-02 ENCOUNTER — Ambulatory Visit (INDEPENDENT_AMBULATORY_CARE_PROVIDER_SITE_OTHER): Admitting: Otolaryngology

## 2024-06-03 ENCOUNTER — Ambulatory Visit: Admitting: Pulmonary Disease

## 2024-06-06 ENCOUNTER — Other Ambulatory Visit: Payer: Self-pay | Admitting: Cardiovascular Disease

## 2024-06-10 ENCOUNTER — Other Ambulatory Visit: Payer: Self-pay | Admitting: Family Medicine

## 2024-06-17 ENCOUNTER — Ambulatory Visit (INDEPENDENT_AMBULATORY_CARE_PROVIDER_SITE_OTHER)

## 2024-06-17 ENCOUNTER — Ambulatory Visit (INDEPENDENT_AMBULATORY_CARE_PROVIDER_SITE_OTHER): Admitting: Otolaryngology

## 2024-06-27 ENCOUNTER — Other Ambulatory Visit: Payer: Self-pay | Admitting: Cardiovascular Disease

## 2024-07-18 ENCOUNTER — Other Ambulatory Visit: Payer: Self-pay | Admitting: Cardiovascular Disease

## 2024-07-19 ENCOUNTER — Ambulatory Visit: Admitting: Pulmonary Disease

## 2024-07-19 ENCOUNTER — Encounter: Payer: Self-pay | Admitting: Pulmonary Disease

## 2024-07-19 VITALS — BP 117/76 | HR 84 | Ht 71.0 in | Wt 224.0 lb

## 2024-07-19 DIAGNOSIS — J342 Deviated nasal septum: Secondary | ICD-10-CM | POA: Diagnosis not present

## 2024-07-19 DIAGNOSIS — R0683 Snoring: Secondary | ICD-10-CM | POA: Diagnosis not present

## 2024-07-19 DIAGNOSIS — G478 Other sleep disorders: Secondary | ICD-10-CM

## 2024-07-19 DIAGNOSIS — K219 Gastro-esophageal reflux disease without esophagitis: Secondary | ICD-10-CM | POA: Diagnosis not present

## 2024-07-19 DIAGNOSIS — E785 Hyperlipidemia, unspecified: Secondary | ICD-10-CM | POA: Diagnosis not present

## 2024-07-19 DIAGNOSIS — E66811 Obesity, class 1: Secondary | ICD-10-CM | POA: Diagnosis not present

## 2024-07-19 DIAGNOSIS — Z87891 Personal history of nicotine dependence: Secondary | ICD-10-CM

## 2024-07-19 NOTE — Progress Notes (Addendum)
 Isaac Adams    [id number]    [birth year]  Primary Care Physician:Fry, Garnette LABOR, MD  Referring Physician: Okey Burns, MD [address] [place],  [place] [id number]  Chief complaint:   Concern for sleep apnea  Discussed the use of AI scribe software for clinical note transcription with the patient, who gave verbal consent to proceed.  History of Present Illness Isaac Adams is a 48 year old male who presents with concerns about sleep apnea and snoring. He was referred by a doctor who previously saw him for sinus issues.  He is primarily concerned about sleep apnea due to his wife's complaints about his excessive snoring, which disrupts her sleep. He has a history of sinus issues and a deviated septum, which he suspects may contribute to his snoring. He underwent a sleep study approximately ten years ago, which was negative, although he recalls waking himself up from snoring during the study.  He experiences occasional morning headaches and sometimes has night sweats, though not frequently. No significant dryness of the mouth upon waking. He denies frequent nighttime awakenings except when his wife nudges him due to his snoring. He does not get up to use the bathroom at night and has not been told that he holds his breath during sleep.  He typically goes to bed between 10 and 11 PM and wakes up between 6 and 7 AM on workdays. He does not wake up feeling rested and describes difficulty waking up quickly in the mornings. He does not have a family history of sleep apnea.  His past medical history includes high cholesterol and a slight heart murmur, but no significant heart problems. He wears a 16.5-inch neck size shirt. He has tried oral devices for snoring in the past but found them uncomfortable, often spitting them out during the night.  Previous study was over 10 years ago and was negative  History of hypercholesterolemia, allergy sinus problems Deviated  septum  Quit smoking in 2020, has 1-2 drinks a week  Usually does not feel too sleepy during the day  Outpatient medications reviewed including Xanax , Lipitor, EpiPen , Nexium , Pepcid , Flonase , sildenafil ,   Allergies as of 07/19/2024   (No Known Allergies)    Past Medical History:  Diagnosis Date   Abdominal pain, left lower quadrant    Acute bronchitis    GERD (gastroesophageal reflux disease)    Heart murmur    as a child, resolved    Inguinal hernia    Lt groin pain    occasional     Past Surgical History:  Procedure Laterality Date   WISDOM TOOTH EXTRACTION      Family History  Problem Relation Age of Onset   Ovarian cancer Maternal Grandmother    Lung cancer Maternal Uncle     Social History   Socioeconomic History   Marital status: Married    Spouse name: Not on file   Number of children: Not on file   Years of education: Not on file   Highest education level: Not on file  Occupational History   Not on file  Tobacco Use   Smoking status: Former    Current packs/day: 0.00    Types: Cigarettes    Quit date: 2021    Years since quitting: 4.9   Smokeless tobacco: Never  Vaping Use   Vaping status: Never Used  Substance and Sexual Activity   Alcohol use: Yes   Drug use: No  Sexual activity: Yes    Partners: Female  Other Topics Concern   Not on file  Social History Narrative   Not on file   Social Drivers of Health   Tobacco Use: Medium Risk (07/19/2024)   Patient History    Smoking Tobacco Use: Former    Smokeless Tobacco Use: Never    Passive Exposure: Not on Actuary Strain: Not on file  Food Insecurity: Not on file  Transportation Needs: Not on file  Physical Activity: Not on file  Stress: Not on file  Social Connections: Not on file  Intimate Partner Violence: Not on file  Depression (PHQ2-9): Low Risk (03/07/2023)   Depression (PHQ2-9)    PHQ-2 Score: 4  Alcohol Screen: Not on file  Housing: Not on file   Utilities: Not on file  Health Literacy: Not on file    Review of Systems  Respiratory:  Negative for chest tightness.   Psychiatric/Behavioral:  Positive for sleep disturbance.     Vitals:   07/19/24 0833  BP: 117/76  Pulse: 84     Physical Exam Constitutional:      Appearance: He is obese.  HENT:     Head: Normocephalic.     Nose: Nose normal.     Mouth/Throat:     Mouth: Mucous membranes are moist.     Comments: Crowded oropharynx Mallampati 3 Eyes:     General: No scleral icterus.    Pupils: Pupils are equal, round, and reactive to light.  Cardiovascular:     Rate and Rhythm: Normal rate and regular rhythm.     Heart sounds: No murmur heard.    No friction rub.  Pulmonary:     Effort: No respiratory distress.     Breath sounds: No stridor. No wheezing or rhonchi.  Musculoskeletal:     Cervical back: No rigidity or tenderness.  Neurological:     Mental Status: He is alert.  Psychiatric:        Mood and Affect: Mood normal.   Data Reviewed: Previous study not available to be reviewed  Records from Dr. Jackie office visit reviewed, 01/19/2024 - Was seen for snoring, chronic nasal stuffiness and congestion-prescribed nasal steroids  Epworth Sleepiness Scale of 5  Neck size of 16-1/2  Assessment and Plan Assessment & Plan Snoring Chronic snoring with a deviated septum and sinus issues. Previous negative sleep study 10 years ago. No significant daytime sleepiness, morning headaches, or night sweats. No family history of sleep apnea. Differential includes primary snoring versus sleep apnea. Home sleep studies can be falsely negative in up to 25% of cases, especially in individuals with a low risk profile. In-lab sleep study is recommended to rule out sleep apnea due to previous negative study and potential false negatives with home studies. Pretest probability for significant sleep apnea is low - Ordered in-lab sleep study to rule out sleep apnea. -  Discussed potential treatment options if sleep apnea is confirmed, including CPAP, dental devices, and surgical options. - Consider side sleeping and elevating head by 30 degrees to reduce snoring. - Discuss potential use of custom oral devices if snoring persists without sleep apnea.  Encourage weight loss efforts Class I obesity  Hyperlipidemia - Controlled  GERD - Controlled   Orders Placed This Encounter  Procedures   Polysomnography 4 or more parameters (NPSG)    Standing Status:   Future    Expiration Date:   07/19/2025    Where should this test be performed::  [facility]      Jennet Epley MD [facility] 07/19/2024, 9:12 AM  CC: Okey Burns, MD

## 2024-07-19 NOTE — Patient Instructions (Signed)
 We will schedule you for an in-lab sleep study  We will inform you with the results as soon as reviewed  Tentative follow-up in about 3 months  Behavioral measures to help your sleep quality should be instituted -Try and make sure you continue to get about 7 to 8 hours of sleep every night -Sleeping with the head of the bed elevated by about 30 degrees may help snoring -Avoiding alcohol, heavy meals close to bedtime may help sleep quality  Call us  with significant concerns

## 2024-07-19 NOTE — Telephone Encounter (Signed)
 Pt of Dr. Delford. Passed 3rd attempt. Does Dr. Delford want to refill? Please advise.

## 2024-07-24 ENCOUNTER — Ambulatory Visit (INDEPENDENT_AMBULATORY_CARE_PROVIDER_SITE_OTHER)

## 2024-07-25 ENCOUNTER — Encounter: Payer: Self-pay | Admitting: Family Medicine

## 2024-07-25 DIAGNOSIS — M5459 Other low back pain: Secondary | ICD-10-CM | POA: Diagnosis not present

## 2024-07-25 DIAGNOSIS — R21 Rash and other nonspecific skin eruption: Secondary | ICD-10-CM

## 2024-07-26 DIAGNOSIS — M5459 Other low back pain: Secondary | ICD-10-CM | POA: Diagnosis not present

## 2024-07-26 NOTE — Telephone Encounter (Signed)
 I did the referral

## 2024-07-28 ENCOUNTER — Encounter: Payer: Self-pay | Admitting: Family Medicine

## 2024-07-29 NOTE — Telephone Encounter (Signed)
 We will get the labs drawn after the appt, but remind me that day to order the lipo A and B

## 2024-07-31 DIAGNOSIS — M545 Low back pain, unspecified: Secondary | ICD-10-CM | POA: Diagnosis not present

## 2024-08-30 ENCOUNTER — Ambulatory Visit (INDEPENDENT_AMBULATORY_CARE_PROVIDER_SITE_OTHER): Admitting: Family Medicine

## 2024-08-30 ENCOUNTER — Encounter: Payer: Self-pay | Admitting: Family Medicine

## 2024-08-30 VITALS — BP 100/68 | HR 86 | Temp 98.6°F | Ht 71.0 in | Wt 221.0 lb

## 2024-08-30 DIAGNOSIS — L709 Acne, unspecified: Secondary | ICD-10-CM | POA: Diagnosis not present

## 2024-08-30 DIAGNOSIS — Z23 Encounter for immunization: Secondary | ICD-10-CM | POA: Diagnosis not present

## 2024-08-30 DIAGNOSIS — Z0000 Encounter for general adult medical examination without abnormal findings: Secondary | ICD-10-CM | POA: Diagnosis not present

## 2024-08-30 DIAGNOSIS — E785 Hyperlipidemia, unspecified: Secondary | ICD-10-CM | POA: Diagnosis not present

## 2024-08-30 MED ORDER — ATORVASTATIN CALCIUM 40 MG PO TABS: 20.0000 mg | ORAL_TABLET | Freq: Every day | ORAL | Status: AC

## 2024-08-30 MED ORDER — METRONIDAZOLE 0.75 % EX GEL: 1.0000 | Freq: Two times a day (BID) | CUTANEOUS | 5 refills | Status: AC
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
## 2024-09-03 ENCOUNTER — Ambulatory Visit: Payer: Self-pay | Admitting: Family Medicine

## 2024-09-03 LAB — HEPATIC FUNCTION PANEL
ALT: 44 [IU]/L (ref 0–44)
AST: 42 [IU]/L — ABNORMAL HIGH (ref 0–40)
Albumin: 4.8 g/dL (ref 4.1–5.1)
Alkaline Phosphatase: 77 [IU]/L (ref 47–123)
Bilirubin Total: 0.8 mg/dL (ref 0.0–1.2)
Bilirubin, Direct: 0.21 mg/dL (ref 0.00–0.40)
Total Protein: 7.6 g/dL (ref 6.0–8.5)

## 2024-09-03 LAB — CBC WITH DIFFERENTIAL/PLATELET
Basophils Absolute: 0 10*3/uL (ref 0.0–0.2)
Basos: 1 %
EOS (ABSOLUTE): 0.1 10*3/uL (ref 0.0–0.4)
Eos: 2 %
Hematocrit: 48.9 % (ref 37.5–51.0)
Hemoglobin: 16.3 g/dL (ref 13.0–17.7)
Immature Grans (Abs): 0 10*3/uL (ref 0.0–0.1)
Immature Granulocytes: 0 %
Lymphocytes Absolute: 2.5 10*3/uL (ref 0.7–3.1)
Lymphs: 39 %
MCH: 30.2 pg (ref 26.6–33.0)
MCHC: 33.3 g/dL (ref 31.5–35.7)
MCV: 91 fL (ref 79–97)
Monocytes Absolute: 0.3 10*3/uL (ref 0.1–0.9)
Monocytes: 5 %
Neutrophils Absolute: 3.3 10*3/uL (ref 1.4–7.0)
Neutrophils: 53 %
Platelets: 338 10*3/uL (ref 150–450)
RBC: 5.4 x10E6/uL (ref 4.14–5.80)
RDW: 12.9 % (ref 11.6–15.4)
WBC: 6.2 10*3/uL (ref 3.4–10.8)

## 2024-09-03 LAB — BASIC METABOLIC PANEL WITH GFR
BUN/Creatinine Ratio: 11 (ref 9–20)
BUN: 14 mg/dL (ref 6–24)
CO2: 22 mmol/L (ref 20–29)
Calcium: 9.6 mg/dL (ref 8.7–10.2)
Chloride: 101 mmol/L (ref 96–106)
Creatinine, Ser: 1.32 mg/dL — ABNORMAL HIGH (ref 0.76–1.27)
Glucose: 103 mg/dL — ABNORMAL HIGH (ref 70–99)
Potassium: 4.3 mmol/L (ref 3.5–5.2)
Sodium: 139 mmol/L (ref 134–144)
eGFR: 67 mL/min/{1.73_m2}

## 2024-09-03 LAB — LIPID PANEL
Chol/HDL Ratio: 4.7 ratio (ref 0.0–5.0)
Cholesterol, Total: 192 mg/dL (ref 100–199)
HDL: 41 mg/dL
LDL Chol Calc (NIH): 128 mg/dL — ABNORMAL HIGH (ref 0–99)
Triglycerides: 129 mg/dL (ref 0–149)
VLDL Cholesterol Cal: 23 mg/dL (ref 5–40)

## 2024-09-03 LAB — LIPOPROTEIN A (LPA): Lipoprotein (a): 356.1 nmol/L — AB

## 2024-09-03 LAB — TSH: TSH: 1.58 u[IU]/mL (ref 0.450–4.500)

## 2024-09-03 LAB — HEMOGLOBIN A1C
Est. average glucose Bld gHb Est-mCnc: 128 mg/dL
Hgb A1c MFr Bld: 6.1 % — ABNORMAL HIGH (ref 4.8–5.6)

## 2024-09-03 LAB — APOLIPOPROTEIN B: Apolipoprotein B: 106 mg/dL — ABNORMAL HIGH

## 2024-09-29 ENCOUNTER — Ambulatory Visit (HOSPITAL_BASED_OUTPATIENT_CLINIC_OR_DEPARTMENT_OTHER): Admitting: Pulmonary Disease

## 2024-10-25 ENCOUNTER — Ambulatory Visit: Admitting: Pulmonary Disease

## 2024-10-29 ENCOUNTER — Ambulatory Visit: Admitting: Pulmonary Disease
